# Patient Record
Sex: Female | Born: 1960 | Hispanic: Yes | Marital: Married | Smoking: Former smoker
Health system: Southern US, Community
[De-identification: ages and names within clinical notes are randomized; demographics above are authoritative.]

## PROBLEM LIST (undated history)

## (undated) DIAGNOSIS — O1490 Unspecified pre-eclampsia, unspecified trimester: Secondary | ICD-10-CM

## (undated) DIAGNOSIS — I10 Essential (primary) hypertension: Secondary | ICD-10-CM

## (undated) DIAGNOSIS — T7840XA Allergy, unspecified, initial encounter: Secondary | ICD-10-CM

## (undated) DIAGNOSIS — F32A Depression, unspecified: Secondary | ICD-10-CM

## (undated) HISTORY — PX: TUBAL LIGATION: SHX77

## (undated) HISTORY — PX: JOINT REPLACEMENT: SHX530

## (undated) HISTORY — DX: Allergy, unspecified, initial encounter: T78.40XA

## (undated) HISTORY — PX: FOOT SURGERY: SHX648

---

## 2005-10-01 ENCOUNTER — Ambulatory Visit: Payer: Self-pay | Admitting: *Deleted

## 2012-12-06 ENCOUNTER — Emergency Department: Payer: Self-pay | Admitting: Emergency Medicine

## 2017-05-19 ENCOUNTER — Emergency Department: Payer: BLUE CROSS/BLUE SHIELD

## 2017-05-19 ENCOUNTER — Observation Stay
Admission: EM | Admit: 2017-05-19 | Discharge: 2017-05-20 | Disposition: A | Discharge status: Home / Self Care | Payer: BLUE CROSS/BLUE SHIELD | Attending: Internal Medicine | Admitting: Internal Medicine

## 2017-05-19 DIAGNOSIS — R52 Pain, unspecified: Secondary | ICD-10-CM | POA: Diagnosis not present

## 2017-05-19 DIAGNOSIS — Z8249 Family history of ischemic heart disease and other diseases of the circulatory system: Secondary | ICD-10-CM | POA: Diagnosis not present

## 2017-05-19 DIAGNOSIS — S76309A Unspecified injury of muscle, fascia and tendon of the posterior muscle group at thigh level, unspecified thigh, initial encounter: Secondary | ICD-10-CM | POA: Diagnosis present

## 2017-05-19 DIAGNOSIS — Z9889 Other specified postprocedural states: Secondary | ICD-10-CM | POA: Diagnosis not present

## 2017-05-19 DIAGNOSIS — Z87891 Personal history of nicotine dependence: Secondary | ICD-10-CM | POA: Diagnosis not present

## 2017-05-19 DIAGNOSIS — M7121 Synovial cyst of popliteal space [Baker], right knee: Secondary | ICD-10-CM | POA: Diagnosis not present

## 2017-05-19 DIAGNOSIS — W000XXA Fall on same level due to ice and snow, initial encounter: Secondary | ICD-10-CM | POA: Insufficient documentation

## 2017-05-19 DIAGNOSIS — S76312A Strain of muscle, fascia and tendon of the posterior muscle group at thigh level, left thigh, initial encounter: Secondary | ICD-10-CM | POA: Diagnosis not present

## 2017-05-19 DIAGNOSIS — M7122 Synovial cyst of popliteal space [Baker], left knee: Secondary | ICD-10-CM | POA: Insufficient documentation

## 2017-05-19 DIAGNOSIS — R2689 Other abnormalities of gait and mobility: Secondary | ICD-10-CM | POA: Insufficient documentation

## 2017-05-19 DIAGNOSIS — W19XXXA Unspecified fall, initial encounter: Secondary | ICD-10-CM

## 2017-05-19 DIAGNOSIS — Z833 Family history of diabetes mellitus: Secondary | ICD-10-CM | POA: Diagnosis not present

## 2017-05-19 DIAGNOSIS — R1011 Right upper quadrant pain: Secondary | ICD-10-CM

## 2017-05-19 LAB — COMPREHENSIVE METABOLIC PANEL
ALBUMIN: 3.8 g/dL (ref 3.5–5.0)
ALT: 18 U/L (ref 14–54)
ANION GAP: 8 (ref 5–15)
AST: 30 U/L (ref 15–41)
Alkaline Phosphatase: 68 U/L (ref 38–126)
BILIRUBIN TOTAL: 0.6 mg/dL (ref 0.3–1.2)
BUN: 13 mg/dL (ref 6–20)
CALCIUM: 9.1 mg/dL (ref 8.9–10.3)
CO2: 22 mmol/L (ref 22–32)
Chloride: 108 mmol/L (ref 101–111)
Creatinine, Ser: 0.69 mg/dL (ref 0.44–1.00)
GFR calc Af Amer: >60 mL/min (ref >60)
GLUCOSE: 146 mg/dL — AB (ref 65–99)
POTASSIUM: 4.7 mmol/L (ref 3.5–5.1)
Sodium: 138 mmol/L (ref 135–145)
TOTAL PROTEIN: 6.9 g/dL (ref 6.5–8.1)

## 2017-05-19 LAB — CBC WITH DIFFERENTIAL/PLATELET
BASOS ABS: 0 10*3/uL (ref 0–0.1)
BASOS PCT: 0 %
EOS PCT: 5 %
Eosinophils Absolute: 0.6 10*3/uL (ref 0–0.7)
HCT: 38.9 % (ref 35.0–47.0)
Hemoglobin: 13.3 g/dL (ref 12.0–16.0)
LYMPHS PCT: 21 %
Lymphs Abs: 2.2 10*3/uL (ref 1.0–3.6)
MCH: 30.3 pg (ref 26.0–34.0)
MCHC: 34.1 g/dL (ref 32.0–36.0)
MCV: 88.7 fL (ref 80.0–100.0)
MONO ABS: 0.5 10*3/uL (ref 0.2–0.9)
Monocytes Relative: 5 %
NEUTROS ABS: 7.3 10*3/uL — AB (ref 1.4–6.5)
Neutrophils Relative %: 69 %
PLATELETS: 236 10*3/uL (ref 150–440)
RBC: 4.39 MIL/uL (ref 3.80–5.20)
RDW: 12.8 % (ref 11.5–14.5)
WBC: 10.5 10*3/uL (ref 3.6–11.0)

## 2017-05-19 MED ORDER — SODIUM CHLORIDE 0.9 % IV BOLUS (SEPSIS)
1000.0000 mL | Freq: Once | INTRAVENOUS | Status: AC
  Administered 2017-05-19: 1000 mL via INTRAVENOUS

## 2017-05-19 MED ORDER — FENTANYL CITRATE (PF) 100 MCG/2ML IJ SOLN
50.0000 ug | Freq: Once | INTRAMUSCULAR | Status: AC
Start: 2017-05-19 — End: 2017-05-19
  Administered 2017-05-19: 50 ug via INTRAVENOUS
  Filled 2017-05-19: qty 2

## 2017-05-19 MED ORDER — ONDANSETRON 4 MG PO TBDP
4.0000 mg | ORAL_TABLET | Freq: Once | ORAL | Status: AC
  Administered 2017-05-19: 4 mg via ORAL
  Filled 2017-05-19: qty 1

## 2017-05-19 MED ORDER — FENTANYL CITRATE (PF) 100 MCG/2ML IJ SOLN
50.0000 ug | Freq: Once | INTRAMUSCULAR | Status: AC
  Administered 2017-05-19: 50 ug via INTRAVENOUS
  Filled 2017-05-19: qty 2

## 2017-05-19 NOTE — ED Triage Notes (Addendum)
Pedal pulse not felt in right foot manually. Capillary refil < 3 seconds bilaterally. Pedal pulse detected with doppler.

## 2017-05-19 NOTE — ED Provider Notes (Signed)
[facility] Emergency Department Provider Note  ____________________________________________  Time seen: Approximately 10:59 PM  I have reviewed the triage vital signs and the nursing notes.   HISTORY  Chief Complaint Fall and Leg Pain    HPI Angel Douglas is a 57 y.o. female who presents the emergency department complaining of sharp right mid femur pain.  Patient reports that she was in her backyard, slipped on some mild, did a split.  Patient reports that her right leg directly out front of her.  She is having pain to the posterior mid femur.  Patient reports that the pain was so severe, she became lightheaded, dizzy, nauseated.  Patient has had emesis from the nausea.  She did not hit her head or lose consciousness.  She does not take any medications prior to arrival.  No history of previous hip or finger fractures.  Patient denies any back pain, numbness or tingling in the lower extremity.  No other complaints at this time.  History reviewed. No pertinent past medical history.  There are no active problems to display for this patient.   Past Surgical History:  Procedure Laterality Date  . CESAREAN SECTION    . FOOT SURGERY    . JOINT REPLACEMENT    . TUBAL LIGATION      Prior to Admission medications   Not on File    Allergies Patient has no known allergies.  No family history on file.  Social History Social History   Tobacco Use  . Smoking status: Former Games developer  . Smokeless tobacco: Never Used  Substance Use Topics  . Alcohol use: Yes    Frequency: Never  . Drug use: Not on file     Review of Systems  Constitutional: No fever/chills Eyes: No visual changes. No discharge ENT: No upper respiratory complaints. Cardiovascular: no chest pain. Respiratory: no cough. No SOB. Gastrointestinal: No abdominal pain.  Positive for nausea and emesis.   Musculoskeletal: Positive for right posterior mid femur pain Skin: Negative for rash,  abrasions, lacerations, ecchymosis. Neurological: Negative for headaches, focal weakness or numbness. 10-point ROS otherwise negative.  ____________________________________________   PHYSICAL EXAM:  VITAL SIGNS: ED Triage Vitals  Enc Vitals Group     BP 05/19/17 2134 93/72     Pulse Rate 05/19/17 2134 (!) 131     Resp 05/19/17 2134 (!) 21     Temp 05/19/17 2134 97.6 F (36.4 C)     Temp Source 05/19/17 2134 Oral     SpO2 05/19/17 2134 95 %     Weight 05/19/17 2138 193 lb (87.5 kg)     Height --      Head Circumference --      Peak Flow --      Pain Score 05/19/17 2138 10     Pain Loc --      Pain Edu? --      Excl. in GC? --      Constitutional: Alert and oriented. Well appearing and in no acute distress. Eyes: Conjunctivae are normal. PERRL. EOMI. Head: Atraumatic. Neck: No stridor.    Cardiovascular: Normal rate, regular rhythm. Normal S1 and S2.  Good peripheral circulation. Respiratory: Normal respiratory effort without tachypnea or retractions. Lungs CTAB. Good air entry to the bases with no decreased or absent breath sounds. Musculoskeletal: Good range of motion to the right lower extremity due to pain.  Patient is very tender to palpation over the posterior midshaft femur.  Patient does have appreciable palpable deformity.  Palpation over the distal hamstring region reveals palpable deficit with compared with other extremity..  On palpation, distal hamstring tendons are not appreciated.  Examination of the hip and knee is feeling no tenderness to palpation.  Full range of motion to the hip and knee..  Dorsalis pedis pulse was initially difficult to obtain, obtained using Doppler ultrasound.  At this time, patient does have an appreciable dorsalis pedis pulse with direct palpation.  Sensation intact all 5 digits. Neurologic:  Normal speech and language. No gross focal neurologic deficits are appreciated.  Skin:  Skin is warm, dry and intact. No rash noted. Psychiatric:  Mood and affect are normal. Speech and behavior are normal. Patient exhibits appropriate insight and judgement.   ____________________________________________   LABS (all labs ordered are listed, but only abnormal results are displayed)  Labs Reviewed  COMPREHENSIVE METABOLIC PANEL - Abnormal; Notable for the following components:      Result Value   Glucose, Bld 146 (*)    All other components within normal limits  CBC WITH DIFFERENTIAL/PLATELET - Abnormal; Notable for the following components:   Neutro Abs 7.3 (*)    All other components within normal limits   ____________________________________________  EKG   ____________________________________________  RADIOLOGY Festus BarrenI, Jonathan D Cuthriell, personally viewed and evaluated these images (plain radiographs) as part of my medical decision making, as well as reviewing the written report by the radiologist.  Dg Pelvis 1-2 Views  Result Date: 05/19/2017 CLINICAL DATA:  Pain after slipped in mud today. EXAM: PELVIS - 1-2 VIEW COMPARISON:  None. FINDINGS: There is no evidence of pelvic fracture or diastasis. No pelvic bone lesions are seen. Lower lumbar facet arthropathy seen at L4-5 and L5-S1. Intact sacroiliac joints and pubic symphysis. Intact hip joints. Small phlebolith in the right hemipelvis. IMPRESSION: No acute osseous abnormality. Electronically Signed   By: Tollie Ethavid  Kwon M.D.   On: 05/19/2017 22:14   [place] Rt Lower Extrem Ltd Soft Tissue Non Vascular  Result Date: 05/19/2017 CLINICAL DATA:  Fall with pain to the right upper leg EXAM: ULTRASOUND right LOWER EXTREMITY LIMITED TECHNIQUE: Ultrasound examination of the lower extremity soft tissues was performed in the area of clinical concern. COMPARISON:  None. FINDINGS: Targeted ultrasound of the posterior thigh performed in the region of pain. In this region is a large heterogeneous hyper and hypoechoic ill-defined mass measuring 10.1 x 4.3 x 7.3 cm. No appreciable internal flow on  color Doppler imaging. IMPRESSION: Large heterogeneous hyper and hypoechoic mass measuring at least 10.1 cm within the posterior thigh soft tissues in the region of pain. This appears to be within the deeper soft tissues, and may reflect an intramuscular hematoma Electronically Signed   By: Jasmine PangKim  Fujinaga M.D.   On: 05/19/2017 23:51   Dg Femur Min 2 Views Right  Result Date: 05/19/2017 CLINICAL DATA:  Right upper thigh pain after slipping in much tonight. EXAM: RIGHT FEMUR 2 VIEWS COMPARISON:  None. FINDINGS: There is no evidence of fracture or other focal bone lesions. Vascular channel is noted within the posterior diaphysis of the femur on the frog-leg lateral view. Soft tissues are unremarkable. IMPRESSION: No acute fracture nor joint dislocations involving the right femur. Electronically Signed   By: Tollie Ethavid  Kwon M.D.   On: 05/19/2017 22:13    ____________________________________________    PROCEDURES  Procedure(s) performed:    Procedures    Medications  ondansetron (ZOFRAN-ODT) disintegrating tablet 4 mg (4 mg Oral Given 05/19/17 2226)  sodium chloride 0.9 % bolus 1,000 mL (  1,000 mLs Intravenous New Bag/Given 05/19/17 2226)  fentaNYL (SUBLIMAZE) injection 50 mcg (50 mcg Intravenous Given 05/19/17 2227)  fentaNYL (SUBLIMAZE) injection 50 mcg (50 mcg Intravenous Given 05/19/17 2337)     ____________________________________________   INITIAL IMPRESSION / ASSESSMENT AND PLAN / ED COURSE  Pertinent labs & imaging results that were available during my care of the patient were reviewed by me and considered in my medical decision making (see chart for details).  Review of the [place] CSRS was performed in accordance of the [facility] prior to dispensing any controlled drugs.  Clinical Course as of May 20 17  Wed May 19, 2017  2305 Patient presented to the emergency department complaining of severe right posterior mid femur pain.  Initial x-rays were unremarkable for fracture.  On exam,  patient's symptoms, physical exam findings are consistent with rupture of the hamstring.  Patient will be evaluated with soft tissue ultrasound  [JC]  Thu May 20, 2017  0004 She is ultrasound returned with a heterogenous hyper and hypoechoic mass of at least 10.1 cm in the posterior soft tissues of the thigh.  At this time, MRI will be ordered to further evaluate this area.  [JC]    Clinical Course User Index [JC] Cuthriell, Delorise Royals, PA-C    Patient presented to the emergency department complaining of severe posterior right mid femur pain.  Initial x-rays reveal no acute fracture.  Likely hypertensive, with difficulty appreciating the dorsalis pedis pulse.  Exam was concerning for hamstring rupture versus arterial injury.  Ultrasound reveals nonspecific mass in the posterior musculature.  Since pressure has improved to 114/54 at this time.  Patient has received 1 L of fluids, IV pain medication.  She is appreciably more stable.  At this time, MRI is ordered to further evaluate.  I discussed the patient with Dr. Ernest Pine the on-call orthopedic surgeon.  He advises that if this does return with a hamstring rupture, he will follow-up with patient as an outpatient and discuss options with her at that time.  He states that this time it would not be an urgent surgical issue.  At this time, section of the emergency department is closing and patient care is turned over to attending provider, Dr. Manson Passey for further management.      This chart was dictated using voice recognition software/Dragon. Despite best efforts to proofread, errors can occur which can change the meaning. Any change was purely unintentional.    Racheal Patches, PA-C 05/20/17 0019    Darci Current, MD 05/20/17 [phone]

## 2017-05-19 NOTE — ED Notes (Signed)
Patient transported to Ultrasound 

## 2017-05-19 NOTE — ED Notes (Signed)
Patient states she walking to chicken coop in the yard and slipped on mud wearing flip flops. Now c/o right thigh pain. Patient continues to have palpable pulses bilaterally. No obvious deformity present.

## 2017-05-19 NOTE — ED Triage Notes (Signed)
Patient slipped in mud. Patient c/o right upper thigh pain. Patient c/o nausea and 10 out of 10 pain.

## 2017-05-20 ENCOUNTER — Emergency Department: Payer: BLUE CROSS/BLUE SHIELD

## 2017-05-20 ENCOUNTER — Encounter: Payer: Self-pay | Admitting: Internal Medicine

## 2017-05-20 ENCOUNTER — Other Ambulatory Visit: Payer: Self-pay

## 2017-05-20 DIAGNOSIS — R52 Pain, unspecified: Secondary | ICD-10-CM | POA: Diagnosis present

## 2017-05-20 LAB — HEMOGLOBIN AND HEMATOCRIT, BLOOD
HEMATOCRIT: 31.8 % — AB (ref 35.0–47.0)
HEMOGLOBIN: 11.1 g/dL — AB (ref 12.0–16.0)

## 2017-05-20 MED ORDER — OXYCODONE-ACETAMINOPHEN 7.5-325 MG PO TABS: 1.0000 | ORAL_TABLET | ORAL | 0 refills | Status: AC | PRN

## 2017-05-20 MED ORDER — DOCUSATE SODIUM 100 MG PO CAPS: 100.0000 mg | ORAL_CAPSULE | Freq: Two times a day (BID) | ORAL | 0 refills | Status: DC

## 2017-05-20 MED ORDER — ENOXAPARIN SODIUM 40 MG/0.4ML ~~LOC~~ SOLN: 40.0000 mg | SUBCUTANEOUS | Status: DC

## 2017-05-20 MED ORDER — MORPHINE SULFATE (PF) 2 MG/ML IV SOLN: 2.0000 mg | INTRAVENOUS | Status: DC | PRN

## 2017-05-20 MED ORDER — ACETAMINOPHEN 325 MG PO TABS: 650.0000 mg | ORAL_TABLET | Freq: Four times a day (QID) | ORAL | 0 refills | Status: AC | PRN

## 2017-05-20 MED ORDER — ACETAMINOPHEN 650 MG RE SUPP: 650.0000 mg | Freq: Four times a day (QID) | RECTAL | Status: DC | PRN

## 2017-05-20 MED ORDER — ONDANSETRON HCL 4 MG/2ML IJ SOLN: 4.0000 mg | Freq: Four times a day (QID) | INTRAMUSCULAR | Status: DC | PRN

## 2017-05-20 MED ORDER — OXYCODONE HCL 5 MG PO TABS
5.0000 mg | ORAL_TABLET | ORAL | Status: DC | PRN
  Administered 2017-05-20: 5 mg via ORAL
  Filled 2017-05-20: qty 1

## 2017-05-20 MED ORDER — ONDANSETRON HCL 4 MG PO TABS: 4.0000 mg | ORAL_TABLET | Freq: Four times a day (QID) | ORAL | Status: DC | PRN

## 2017-05-20 MED ORDER — OXYCODONE HCL 5 MG PO TABS: 10.0000 mg | ORAL_TABLET | ORAL | Status: DC | PRN

## 2017-05-20 MED ORDER — MORPHINE SULFATE (PF) 4 MG/ML IV SOLN
4.0000 mg | Freq: Once | INTRAVENOUS | Status: AC
  Administered 2017-05-20: 4 mg via INTRAVENOUS
  Filled 2017-05-20: qty 1

## 2017-05-20 MED ORDER — DOCUSATE SODIUM 100 MG PO CAPS
100.0000 mg | ORAL_CAPSULE | Freq: Two times a day (BID) | ORAL | Status: DC
  Filled 2017-05-20: qty 1

## 2017-05-20 MED ORDER — MORPHINE SULFATE (PF) 4 MG/ML IV SOLN
INTRAVENOUS | Status: AC
  Administered 2017-05-20: 05:00:00
  Filled 2017-05-20: qty 1

## 2017-05-20 MED ORDER — OXYCODONE HCL 5 MG PO TABS: 5.0000 mg | ORAL_TABLET | ORAL | 0 refills | Status: DC | PRN

## 2017-05-20 MED ORDER — ACETAMINOPHEN 325 MG PO TABS
650.0000 mg | ORAL_TABLET | Freq: Four times a day (QID) | ORAL | Status: DC | PRN
  Administered 2017-05-20: 650 mg via ORAL
  Filled 2017-05-20: qty 2

## 2017-05-20 NOTE — ED Provider Notes (Signed)
I assumed care of the patient from Cuthriell PA at 11:00 PM.  MRI was performed secondary suspicion for hamstring rupture.  Spoke with the radiologist who confirmed the presence of a hamstring tear with a hamstring tendon avulsion distally.  Spoke with Dr. Ernest PineHooten regarding all clinical findings with recommendation to discharge patient home with outpatient follow-up today however unable to control the patient's pain with IV narcotics and as such patient admitted to Dr. Sheryle Hailiamond secondary to intractable pain.   Darci CurrentBrown, [place] N, MD 05/20/17 [phone]

## 2017-05-20 NOTE — Progress Notes (Signed)
Patient requesting to see Doctor Hooten. Patient made a call to after hours office and they told the pt that MD  is not coming to see pt. Patient requesting to be discharged. MD Hooten paged to confirm. MD Hooten now on the floor.

## 2017-05-20 NOTE — Progress Notes (Signed)
Physical Therapy Evaluation Patient Details Name: Angel Douglas MRN: [id number] DOB: 1961-04-06 Today's Date: 05/20/2017   History of Present Illness  The patient with no chronic medical problems presents to the emergency department after suffering a fall after slipping on water. The patient states that she landed in a split with her RLE and immediately felt pain in her right leg.  Upon examination in the emergency department is obvious the patient had torn her hamstring and was unable to walk without assistance.  Orthopedic surgery was consulted who agreed to see the patient as on an outpatient basis but the patient's pain was too great to bear and she is now admitted under observation status. MRI of R thigh showed right hamstring avulsion with the semitendinosus tendon retracted about 5.5 cm and the semimembranosus retracted up to 14 cm distally as well as muscle strain/tear of the right adductor magnus muscle.   Clinical Impression  Pt admitted with above diagnosis. Pt currently with functional limitations due to the deficits listed below (see PT Problem List). She is independent/modified independent for bed mobility and transfers. With transfers pt demonstrates safe technique and fair weight acceptance to RLE. Good stability when upright in standing. Pt with KI donned during all bed mobility, transfers, and ambulation. Pt ambulates to rehab gym and back to her room with rolling walker. Decreased weight shifting to RLE but considering extent of her injury weight acceptance is considerable. Pt is in fact able to accept full weight on RLE as she is able to take a few short steps without UE support. Reports increase in pain to 5/10 with ambulation. Pt provided education about proper sequencing with stairs. She is able to ascend/descend 4 steps with single hand held assist only. Good safety and stability demonstrated with ambulation. She will be safe to return home with intermittent assist from her  daughter and husband who is out of town most of the time. Pt will benefit from OP PT per ortho recommendations depending on plan of care established. Pt will benefit from PT services to address deficits in strength, balance, and mobility in order to return to full function at home.       Follow Up Recommendations Outpatient PT;Other (comment)(Pending plan of care by orthopedist)    Equipment Recommendations  None recommended by PT;Other (comment)(Pt prefers to use her axillary crutches at home)    Recommendations for Other Services       Precautions / Restrictions Precautions Precautions: None Required Braces or Orthoses: Knee Immobilizer - Right Knee Immobilizer - Right: On when out of bed or walking Restrictions Weight Bearing Restrictions: Yes RLE Weight Bearing: Weight bearing as tolerated Other Position/Activity Restrictions: WBAT per Dr Ernest Pine over the phone. Maintain Knee immobilizer during ambulation for stability and comfort      Mobility  Bed Mobility Overal bed mobility: Independent             General bed mobility comments: Good speed and sequencing with bed mobility.  Transfers Overall transfer level: Modified independent Equipment used: None Transfers: Sit to/from Stand Sit to Stand: Modified independent (Device/Increase time)         General transfer comment: Pt demonstrates safe transfers and fair weight acceptance to RLE. Good stability when upright in standing. Pt with KI donned during all bed mobility, transfers, and ambulation  Ambulation/Gait Ambulation/Gait assistance: Min guard Ambulation Distance (Feet): 200 Feet Assistive device: Rolling walker (2 wheeled) Gait Pattern/deviations: Decreased step length - left;Decreased weight shift to right Gait velocity: Decreased Gait  velocity interpretation: <1.8 ft/sec, indicative of risk for recurrent falls General Gait Details: Pt ambulates to rehab gym and back to her room with rolling walker.  Decreased weight shifting to RLE but considering extent of her injury weight acceptance is considerable. Pt is in fact able to accept full weight on RLE as she is able to take a few short steps without UE support. Reports increase in pain to 5/10 with ambulation  Stairs Stairs: Yes Stairs assistance: Min guard Stair Management: (Hand-held assistance) Number of Stairs: 4 General stair comments: Pt provided education about proper sequencing with stairs. She is able to ascend/descend 4 steps with single hand held assist only. Good safety and stability demonstrated  Wheelchair Mobility    Modified Rankin (Stroke Patients Only)       Balance Overall balance assessment: Needs assistance Sitting-balance support: No upper extremity supported Sitting balance-Leahy Scale: Good     Standing balance support: No upper extremity supported Standing balance-Leahy Scale: Fair Standing balance comment: Able to balance in wide stance without UE support                             Pertinent Vitals/Pain Pain Assessment: 0-10 Pain Score: 5  Pain Location: RLE. Denies pain at rest but reports increase in pain with ambulation Pain Intervention(s): Monitored during session;Premedicated before session    Home Living Family/patient expects to be discharged to:: Private residence Living Arrangements: Spouse/significant other Available Help at Discharge: Family Type of Home: House Home Access: Stairs to enter Entrance Stairs-Rails: None Entrance Stairs-Number of Steps: 3 Home Layout: One level Home Equipment: Cane - single point;Crutches(no shower seat, no walker)      Prior Function Level of Independence: Independent         Comments: Works at the [employer]. Drives and is a full community ambulator without assistive device. No other falls in the last 12 months     Hand Dominance   Dominant Hand: Right    Extremity/Trunk Assessment   Upper  Extremity Assessment Upper Extremity Assessment: Overall WFL for tasks assessed    Lower Extremity Assessment Lower Extremity Assessment: RLE deficits/detail RLE Deficits / Details: Pt reports fully intact sensation to light touch. Full DF/PF. Limited strength testing of RLE due to pain       Communication   Communication: No difficulties  Cognition Arousal/Alertness: Awake/alert Behavior During Therapy: Agitated Overall Cognitive Status: Within Functional Limits for tasks assessed                                 General Comments: Pt is very defensive and expresses frustration with having not seen an orthopedist since admission to the ER      General Comments      Exercises     Assessment/Plan    PT Assessment Patient needs continued PT services  PT Problem List Decreased strength;Decreased mobility;Pain       PT Treatment Interventions DME instruction;Gait training;Stair training;Therapeutic activities;Therapeutic exercise;Balance training;Patient/family education;Manual techniques    PT Goals (Current goals can be found in the Care Plan section)  Acute Rehab PT Goals Patient Stated Goal: Return to prior function at home.  PT Goal Formulation: With patient Time For Goal Achievement: 06/03/17 Potential to Achieve Goals: Good    Frequency 7X/week   Barriers to discharge Decreased caregiver support Lives alone. Husband is out of town for multiple  weeks at a time    Co-evaluation               AM-PAC PT "6 Clicks" Daily Activity  Outcome Measure Difficulty turning over in bed (including adjusting bedclothes, sheets and blankets)?: None Difficulty moving from lying on back to sitting on the side of the bed? : None Difficulty sitting down on and standing up from a chair with arms (e.g., wheelchair, bedside commode, etc,.)?: A Little Help needed moving to and from a bed to chair (including a wheelchair)?: None Help needed walking in hospital  room?: A Little Help needed climbing 3-5 steps with a railing? : A Little 6 Click Score: 21    End of Session Equipment Utilized During Treatment: Gait belt Activity Tolerance: Patient tolerated treatment well Patient left: in bed;with call bell/phone within reach   PT Visit Diagnosis: Muscle weakness (generalized) (M62.81);Pain Pain - Right/Left: Right Pain - part of body: Leg    Time: 1424-1445 PT Time Calculation (min) (ACUTE ONLY): 21 min   Charges:   PT Evaluation $PT Eval Low Complexity: 1 Low     PT G Codes:        Abigael Mogle D Braxon Suder PT, DPT    Sima Lindenberger 05/20/2017, 3:21 PM

## 2017-05-20 NOTE — Evaluation (Signed)
Occupational Therapy Evaluation Patient Details Name: Angel Douglas MRN: [id number] DOB: 03-Jul-1960 Today's Date: 05/20/2017    History of Present Illness The patient with no chronic medical problems presents to the emergency department after suffering a fall after slipping on water. The patient states that she landed in a split with her RLE and immediately felt pain in her right leg.  Upon examination in the emergency department is obvious the patient had torn her hamstring and was unable to walk without assistance.  Orthopedic surgery was consulted who agreed to see the patient as on an outpatient basis but the patient's pain was too great to bear and she is now admitted under observation status. MRI of R thigh showed right hamstring avulsion with the semitendinosus tendon retracted about 5.5 cm and the semimembranosus retracted up to 14 cm distally as well as muscle strain/tear of the right adductor magnus muscle.    Clinical Impression   Pt seen for OT evaluation this date. Pt was independent at baseline and eager to return to PLOF. Pt presents with RLE pain with movement and requires PRN min assist for LB ADL tasks. Educated in AE for LB dressing to improve independence. Pt/dtr deny additional skilled OT needs at this time. Family able to assist as needed for ADL and mobility. Will sign off. Please re-consult if additional needs arise.     Follow Up Recommendations  No OT follow up    Equipment Recommendations  None recommended by OT    Recommendations for Other Services       Precautions / Restrictions Precautions Precautions: None Required Braces or Orthoses: Knee Immobilizer - Right Knee Immobilizer - Right: On when out of bed or walking Restrictions Weight Bearing Restrictions: Yes RLE Weight Bearing: Weight bearing as tolerated Other Position/Activity Restrictions: WBAT per Dr Ernest Pine over the phone. Maintain Knee immobilizer during ambulation for stability and comfort      Mobility Bed Mobility Overal bed mobility: Independent             General bed mobility comments: Good speed and sequencing with bed mobility.  Transfers Overall transfer level: Modified independent Equipment used: None Transfers: Sit to/from Stand Sit to Stand: Modified independent (Device/Increase time)         General transfer comment: Pt demonstrates safe transfers and fair weight acceptance to RLE. Good stability when upright in standing. Pt with KI donned during all bed mobility, transfers, and ambulation    Balance Overall balance assessment: Needs assistance Sitting-balance support: No upper extremity supported Sitting balance-Leahy Scale: Good     Standing balance support: No upper extremity supported Standing balance-Leahy Scale: Fair Standing balance comment: Able to balance in wide stance without UE support                           ADL either performed or assessed with clinical judgement   ADL Overall ADL's : Needs assistance/impaired             Lower Body Bathing: Minimal assistance;Sit to/from stand;With caregiver independent assisting       Lower Body Dressing: Minimal assistance;With caregiver independent assisting;Sit to/from stand Lower Body Dressing Details (indicate cue type and reason): educated in AE for LB dressing to improve independence and maximize safety Toilet Transfer: Modified Independent;RW           Functional mobility during ADLs: Min guard;Rolling walker       Vision Baseline Vision/History: No visual deficits Patient Visual Report:  No change from baseline Vision Assessment?: No apparent visual deficits     Perception     Praxis      Pertinent Vitals/Pain Pain Assessment: (denies pain at rest, increases with mobility) Pain Score: 5  Pain Location: RLE. Denies pain at rest but reports increase in pain with ambulation Pain Intervention(s): Limited activity within patient's tolerance;Monitored during  session     Hand Dominance Right   Extremity/Trunk Assessment Upper Extremity Assessment Upper Extremity Assessment: Overall WFL for tasks assessed   Lower Extremity Assessment Lower Extremity Assessment: Defer to PT evaluation;RLE deficits/detail RLE Deficits / Details: Pt reports fully intact sensation to light touch. Full DF/PF. Limited strength testing of RLE due to pain       Communication Communication Communication: No difficulties   Cognition Arousal/Alertness: Awake/alert Behavior During Therapy: Agitated Overall Cognitive Status: Within Functional Limits for tasks assessed                                 General Comments: Pt is very defensive and expresses frustration with having not seen an orthopedist since admission to the ER   General Comments       Exercises     Shoulder Instructions      Home Living Family/patient expects to be discharged to:: Private residence Living Arrangements: Spouse/significant other Available Help at Discharge: Family Type of Home: House Home Access: Stairs to enter Secretary/administratorntrance Stairs-Number of Steps: 3 Entrance Stairs-Rails: None Home Layout: One level     Bathroom Shower/Tub: Chief Strategy OfficerTub/shower unit   Bathroom Toilet: Standard     Home Equipment: Gilmer Morane - single point;Crutches          Prior Functioning/Environment Level of Independence: Independent        Comments: Works at the [employer]. Drives and is a full community ambulator without assistive device. No other falls in the last 12 months        OT Problem List:        OT Treatment/Interventions:      OT Goals(Current goals can be found in the care plan section) Acute Rehab OT Goals Patient Stated Goal: Return to prior function at home.  OT Goal Formulation: All assessment and education complete, DC therapy  OT Frequency:     Barriers to D/C:            Co-evaluation              AM-PAC PT "6 Clicks" Daily  Activity     Outcome Measure Help from another person eating meals?: None Help from another person taking care of personal grooming?: None Help from another person toileting, which includes using toliet, bedpan, or urinal?: A Little Help from another person bathing (including washing, rinsing, drying)?: A Little Help from another person to put on and taking off regular upper body clothing?: None Help from another person to put on and taking off regular lower body clothing?: A Little 6 Click Score: 21   End of Session    Activity Tolerance: Patient tolerated treatment well Patient left: in bed;with call bell/phone within reach;with bed alarm set;with family/visitor present  OT Visit Diagnosis: Other abnormalities of gait and mobility (R26.89)                Time: 1355-1420 OT Time Calculation (min): 25 min Charges:  OT General Charges $OT Visit: 1 Visit OT Evaluation $OT Eval Low Complexity: 1 Low OT Treatments $  Self Care/Home Management : 8-22 mins  Richrd Prime, MPH, MS, OTR/L ascom [phone] 05/20/17, 3:53 PM

## 2017-05-20 NOTE — H&P (Signed)
Angel Douglas is an 57 y.o. female.   Chief Complaint: Fall HPI: The patient with no chronic medical problems presents to the emergency department after suffering a fall on ice.  The patient states that she landed in a split and immediately felt pain in her right leg.  Upon examination in the emergency department is obvious the patient had torn her hamstring and was unable to walk without assistance.  Orthopedic surgery was consulted who agreed to see the patient as on an outpatient basis but the patient's pain was too great to bear was found to the emergency department staff to call the hospitalist service for admission.  History reviewed. No pertinent past medical history. None  Past Surgical History:  Procedure Laterality Date  . CESAREAN SECTION    . FOOT SURGERY    . JOINT REPLACEMENT    . TUBAL LIGATION      Family History  Problem Relation Age of Onset  . Heart disease Mother   . Diabetes Mellitus II Father   . Diabetes Mellitus II Brother    Social History:  reports that she has quit smoking. she has never used smokeless tobacco. She reports that she drinks alcohol. Her drug history is not on file.  Allergies: No Known Allergies  No medications prior to admission.    Results for orders placed or performed during the hospital encounter of 05/19/17 (from the past 48 hour(s))  Comprehensive metabolic panel     Status: Abnormal   Collection Time: 05/19/17 10:12 PM  Result Value Ref Range   Sodium 138 135 - 145 mmol/L   Potassium 4.7 3.5 - 5.1 mmol/L    Comment: HEMOLYSIS AT THIS LEVEL MAY AFFECT RESULT   Chloride 108 101 - 111 mmol/L   CO2 22 22 - 32 mmol/L   Glucose, Bld 146 (H) 65 - 99 mg/dL   BUN 13 6 - 20 mg/dL   Creatinine, Ser 0.69 0.44 - 1.00 mg/dL   Calcium 9.1 8.9 - 10.3 mg/dL   Total Protein 6.9 6.5 - 8.1 g/dL   Albumin 3.8 3.5 - 5.0 g/dL   AST 30 15 - 41 U/L   ALT 18 14 - 54 U/L   Alkaline Phosphatase 68 38 - 126 U/L   Total Bilirubin 0.6 0.3 - 1.2  mg/dL   GFR calc non Af Amer >60 >60 mL/min   GFR calc Af Amer >60 >60 mL/min    Comment: (NOTE) The eGFR has been calculated using the CKD EPI equation. This calculation has not been validated in all clinical situations. eGFR's persistently <60 mL/min signify possible Chronic Kidney Disease.    Anion gap 8 5 - 15    Comment: Performed at [facility], [address] [place], [place] [address]  CBC with Differential     Status: Abnormal   Collection Time: 05/19/17 10:12 PM  Result Value Ref Range   WBC 10.5 3.6 - 11.0 K/uL   RBC 4.39 3.80 - 5.20 MIL/uL   Hemoglobin 13.3 12.0 - 16.0 g/dL   HCT 38.9 35.0 - 47.0 %   MCV 88.7 80.0 - 100.0 fL   MCH 30.3 26.0 - 34.0 pg   MCHC 34.1 32.0 - 36.0 g/dL   RDW 12.8 11.5 - 14.5 %   Platelets 236 150 - 440 K/uL   Neutrophils Relative % 69 %   Neutro Abs 7.3 (H) 1.4 - 6.5 K/uL   Lymphocytes Relative 21 %   Lymphs Abs 2.2 1.0 - 3.6 K/uL  Monocytes Relative 5 %   Monocytes Absolute 0.5 0.2 - 0.9 K/uL   Eosinophils Relative 5 %   Eosinophils Absolute 0.6 0 - 0.7 K/uL   Basophils Relative 0 %   Basophils Absolute 0.0 0 - 0.1 K/uL    Comment: Performed at [facility], [address] [place], [place] [address]   Dg Pelvis 1-2 Views  Result Date: 05/19/2017 CLINICAL DATA:  Pain after slipped in mud today. EXAM: PELVIS - 1-2 VIEW COMPARISON:  None. FINDINGS: There is no evidence of pelvic fracture or diastasis. No pelvic bone lesions are seen. Lower lumbar facet arthropathy seen at L4-5 and L5-S1. Intact sacroiliac joints and pubic symphysis. Intact hip joints. Small phlebolith in the right hemipelvis. IMPRESSION: No acute osseous abnormality. Electronically Signed   By: Ashley Royalty M.D.   On: 05/19/2017 22:14   [place] Rt Lower Extrem Ltd Soft Tissue Non Vascular  Result Date: 05/19/2017 CLINICAL DATA:  Fall with pain to the right upper leg EXAM: ULTRASOUND right LOWER EXTREMITY LIMITED TECHNIQUE: Ultrasound examination of the  lower extremity soft tissues was performed in the area of clinical concern. COMPARISON:  None. FINDINGS: Targeted ultrasound of the posterior thigh performed in the region of pain. In this region is a large heterogeneous hyper and hypoechoic ill-defined mass measuring 10.1 x 4.3 x 7.3 cm. No appreciable internal flow on color Doppler imaging. IMPRESSION: Large heterogeneous hyper and hypoechoic mass measuring at least 10.1 cm within the posterior thigh soft tissues in the region of pain. This appears to be within the deeper soft tissues, and may reflect an intramuscular hematoma Electronically Signed   By: Donavan Foil M.D.   On: 05/19/2017 23:51   Dg Femur Min 2 Views Right  Result Date: 05/19/2017 CLINICAL DATA:  Right upper thigh pain after slipping in much tonight. EXAM: RIGHT FEMUR 2 VIEWS COMPARISON:  None. FINDINGS: There is no evidence of fracture or other focal bone lesions. Vascular channel is noted within the posterior diaphysis of the femur on the frog-leg lateral view. Soft tissues are unremarkable. IMPRESSION: No acute fracture nor joint dislocations involving the right femur. Electronically Signed   By: Ashley Royalty M.D.   On: 05/19/2017 22:13    Review of Systems  Constitutional: Negative for chills and fever.  HENT: Negative for sore throat and tinnitus.   Eyes: Negative for blurred vision and redness.  Respiratory: Negative for cough and shortness of breath.   Cardiovascular: Negative for chest pain, palpitations, orthopnea and PND.  Gastrointestinal: Negative for abdominal pain, diarrhea, nausea and vomiting.  Genitourinary: Negative for dysuria, frequency and urgency.  Musculoskeletal: Positive for myalgias. Negative for joint pain.  Skin: Negative for rash.       No lesions  Neurological: Negative for speech change, focal weakness and weakness.  Endo/Heme/Allergies: Does not bruise/bleed easily.       No temperature intolerance  Psychiatric/Behavioral: Negative for  depression and suicidal ideas.    Blood pressure 107/80, pulse 70, temperature 97.6 F (36.4 C), temperature source Oral, resp. rate 18, weight 87.5 kg (193 lb), SpO2 98 %. Physical Exam  Vitals reviewed. Constitutional: She is oriented to person, place, and time. She appears well-developed and well-nourished. No distress.  HENT:  Head: Normocephalic and atraumatic.  Mouth/Throat: Oropharynx is clear and moist.  Eyes: Conjunctivae and EOM are normal. Pupils are equal, round, and reactive to light. No scleral icterus.  Neck: Normal range of motion. Neck supple. No JVD present. No tracheal deviation present. No thyromegaly  present.  Cardiovascular: Normal rate, regular rhythm and normal heart sounds. Exam reveals no gallop and no friction rub.  No murmur heard. Respiratory: Effort normal and breath sounds normal.  GI: Soft. Bowel sounds are normal. She exhibits no distension. There is no tenderness.  Genitourinary:  Genitourinary Comments: Deferred  Musculoskeletal: She exhibits no edema.  Cannot extend right leg due to pain and hamstring  Lymphadenopathy:    She has no cervical adenopathy.  Neurological: She is alert and oriented to person, place, and time. No cranial nerve deficit. She exhibits normal muscle tone.  Skin: Skin is warm and dry. No rash noted. No erythema.  Psychiatric: She has a normal mood and affect. Judgment and thought content normal.     Assessment/Plan This is a 57 year old female admitted for intractable pain  1.  Intractable pain: Secondary to hamstring tear; IV narcotics as needed.  PT/OT eval prior to discharge. 2.  DVT prophylaxis: Lanoxin 3.  GI prophylaxis: None The patient is a full code.  Time spent on admission orders and patient care approximately 45 minutes  Harrie Foreman, MD 05/20/2017, 5:38 AM

## 2017-05-20 NOTE — Progress Notes (Signed)
I have seen the patient around noon time, her daughter was present in the room. Had explained to her about the requirement for orthopedic follow-up. She denies much pain now, has immobilizer on her knee in place. I encouraged her to try to get up and walk to the bathroom to assess her functional status abilities. As suggested earlier in the ER by orthopedic, there may not be requirement for any urgent surgeries, in that case we would be able to discharge her in the evening after orthopedic physician evaluate her in the room. She understand and agree with the plan.

## 2017-05-20 NOTE — Consult Note (Signed)
ORTHOPAEDIC CONSULTATION  PATIENT NAME: Angel Douglas DOB: [birth year]  MRN: [id number]  REQUESTING PHYSICIAN: Altamese DillingVachhani, Vaibhavkumar, *  Chief Complaint: Right thigh pain  HPI: Angel Douglas is a 57 y.o. female who complains of posterior right thigh pain.  The patient apparently slipped and fell, landing in a "split" position.  She had the immediate onset of pain to the upper posterior thigh and had significant difficulty with standing or bearing weight due to the pain.  She denied any other injuries.  MRI of the right femur demonstrated right hamstring avulsion with the super tendinosis tendon retracted approximately 5.5 cm and the semimembranosus retracted up to 14 cm distally.  She was admitted to the hospitalist service for pain control.  History reviewed. No pertinent past medical history. Past Surgical History:  Procedure Laterality Date  . CESAREAN SECTION    . FOOT SURGERY    . JOINT REPLACEMENT    . TUBAL LIGATION     Social History   Socioeconomic History  . Marital status: Single    Spouse name: None  . Number of children: None  . Years of education: None  . Highest education level: None  Social Needs  . Financial resource strain: None  . Food insecurity - worry: None  . Food insecurity - inability: None  . Transportation needs - medical: None  . Transportation needs - non-medical: None  Occupational History  . None  Tobacco Use  . Smoking status: Former Games developermoker  . Smokeless tobacco: Never Used  Substance and Sexual Activity  . Alcohol use: Yes    Frequency: Never  . Drug use: None  . Sexual activity: None  Other Topics Concern  . None  Social History Narrative  . None   Family History  Problem Relation Age of Onset  . Heart disease Mother   . Diabetes Mellitus II Father   . Diabetes Mellitus II Brother    No Known Allergies Prior to Admission medications   Medication Sig Start Date End Date Taking? Authorizing Provider  acetaminophen  (TYLENOL) 325 MG tablet Take 2 tablets (650 mg total) by mouth every 6 (six) hours as needed for mild pain (or Fever >/= 101). 05/20/17   Altamese DillingVachhani, Vaibhavkumar, MD  docusate sodium (COLACE) 100 MG capsule Take 1 capsule (100 mg total) by mouth 2 (two) times daily. 05/20/17   Altamese DillingVachhani, Vaibhavkumar, MD  oxyCODONE (OXY IR/ROXICODONE) 5 MG immediate release tablet Take 1 tablet (5 mg total) by mouth every 4 (four) hours as needed for moderate pain or severe pain. 05/20/17   Altamese DillingVachhani, Vaibhavkumar, MD  oxyCODONE-acetaminophen (PERCOCET) 7.5-325 MG tablet Take 1 tablet by mouth every 4 (four) hours as needed for severe pain. 05/20/17 05/20/18  Darci CurrentBrown, [place] N, MD   Dg Pelvis 1-2 Views  Result Date: 05/19/2017 CLINICAL DATA:  Pain after slipped in mud today. EXAM: PELVIS - 1-2 VIEW COMPARISON:  None. FINDINGS: There is no evidence of pelvic fracture or diastasis. No pelvic bone lesions are seen. Lower lumbar facet arthropathy seen at L4-5 and L5-S1. Intact sacroiliac joints and pubic symphysis. Intact hip joints. Small phlebolith in the right hemipelvis. IMPRESSION: No acute osseous abnormality. Electronically Signed   By: Tollie Ethavid  Kwon M.D.   On: 05/19/2017 22:14   Mr Frmur Right Wo Contrast  Result Date: 05/20/2017 CLINICAL DATA:  Fall with right leg pain. EXAM: MRI OF THE RIGHT FEMUR WITHOUT CONTRAST TECHNIQUE: Multiplanar, multisequence MR imaging of the right femur was performed. No intravenous contrast was administered. COMPARISON:  Radiographs from 05/19/2017 FINDINGS: Bones/Joint/Cartilage No significant marrow edema, questionable slight cortical avulsion along the right ischial avulsion site. Ligaments N/A Muscles and Tendons Right hamstring avulsion with the semitendinosus tendon retracted about 5.5 cm and the semimembranosus retracted up to 14 cm distally. Indistinct biceps femoris muscle. There is considerable hematoma along the avulsion gap and tracking around the proximal margins of the musculature  with considerable regional edema which extends to involve the lower portion of the adjacent hip adductor musculature and tissue planes around the affect muscles. The abnormal edema in the adductor magnus muscle on the right may indicate a muscle strain/tear. Soft tissues The regional edema and hematoma related to the avulsion of the right hamstrings surrounds a significant portion of the sciatic nerve in the right lower extremity. No impingement at the sciatic notch. Note is made of small bilateral Baker's cysts. IMPRESSION: 1. Acute right hamstring avulsion with considerable distal retraction of the hamstring musculotendinous structures, intervening hematoma, and extensive edema. Some of this edema tracks around the sciatic nerve in the right thigh. 2. Muscle strain/tear of the right adductor magnus muscle. 3. Small bilateral Baker's cysts. Electronically Signed   By: Gaylyn Rong M.D.   On: 05/20/2017 08:09   [place] Rt Lower Extrem Ltd Soft Tissue Non Vascular  Result Date: 05/19/2017 CLINICAL DATA:  Fall with pain to the right upper leg EXAM: ULTRASOUND right LOWER EXTREMITY LIMITED TECHNIQUE: Ultrasound examination of the lower extremity soft tissues was performed in the area of clinical concern. COMPARISON:  None. FINDINGS: Targeted ultrasound of the posterior thigh performed in the region of pain. In this region is a large heterogeneous hyper and hypoechoic ill-defined mass measuring 10.1 x 4.3 x 7.3 cm. No appreciable internal flow on color Doppler imaging. IMPRESSION: Large heterogeneous hyper and hypoechoic mass measuring at least 10.1 cm within the posterior thigh soft tissues in the region of pain. This appears to be within the deeper soft tissues, and may reflect an intramuscular hematoma Electronically Signed   By: Jasmine Pang M.D.   On: 05/19/2017 23:51   Dg Femur Min 2 Views Right  Result Date: 05/19/2017 CLINICAL DATA:  Right upper thigh pain after slipping in much tonight. EXAM: RIGHT  FEMUR 2 VIEWS COMPARISON:  None. FINDINGS: There is no evidence of fracture or other focal bone lesions. Vascular channel is noted within the posterior diaphysis of the femur on the frog-leg lateral view. Soft tissues are unremarkable. IMPRESSION: No acute fracture nor joint dislocations involving the right femur. Electronically Signed   By: Tollie Eth M.D.   On: 05/19/2017 22:13    Positive ROS: All other systems have been reviewed and were otherwise negative with the exception of those mentioned in the HPI and as above.  Physical Exam: General: Well developed, well nourished female seen in no acute distress. Skin: No lesions in the area of chief complaint Neurologic: Awake, alert, and oriented. Sensory function is grossly intact. Motor strength is felt to be 5 over 5 with the exception of the right lower extremity due to the injury. No clonus or tremor. Good motor coordination. Lymphatic: No axillary or cervical lymphadenopathy  MUSCULOSKELETAL: Examination of the right lower extremity demonstrates significant tenderness to palpation on the ischium and proximal aspect of the posterior thigh.  No appreciable ecchymosis although moderate swelling is present.  Assessment: Right hamstring avulsion  Plan: Notes from physical therapy were reviewed and the patient was deemed to be safe with ambulation. The findings were discussed in detail with the  patient and her daughter.  I have contacted Dr. Armen Pickup at [facility] to arrange for consultation and possible surgical intervention. Continue with cold therapy to minimize swelling. I will make arrangements for a disc of the MRI to be available for her appointment with Dr. Armen Pickup.  Lelar Farewell P. Angie Fava M.D.

## 2017-05-20 NOTE — Progress Notes (Signed)
Spoke with Dr. Ernest PineHooten about the orthopedic consult. Order received to discontinue the patient's lovenxo. Order received for Oxycodone 5 mg every 4 hrs prn for moderate pain. Oxycodone 10 mg every 4 hrs prn for severe pain. Can place polar care to thigh

## 2017-05-25 NOTE — Discharge Summary (Signed)
[facility] - [place] at [facility]   PATIENT NAME: Angel Douglas    MR#:  [id number]  DATE OF BIRTH:  04-Sep-1960  DATE OF ADMISSION:  05/19/2017 ADMITTING PHYSICIAN: Arnaldo NatalMichael S Diamond, MD  DATE OF DISCHARGE: 05/20/2017  8:02 PM  PRIMARY CARE PHYSICIAN: Emogene MorganAycock, Ngwe A, MD    ADMISSION DIAGNOSIS:  Hamstring injury [S76.309A] RUQ pain [R10.11] Fall [W19.XXXA] Intractable pain [R52] Tear of left hamstring, initial encounter [S76.312A]  DISCHARGE DIAGNOSIS:  Active Problems:   Intractable pain   SECONDARY DIAGNOSIS:  History reviewed. No pertinent past medical history.  HOSPITAL COURSE:   Pt has hamstring tear- pain control achieved. Seen by orthopedics and pt choose to go to other sport physicians for further care. Pt is stable to be discharged.  DISCHARGE CONDITIONS:   Stable.  CONSULTS OBTAINED:  Treatment Team:  Lyndle HerrlichBowers, James R, MD Donato HeinzHooten, James P, MD  DRUG ALLERGIES:  No Known Allergies  DISCHARGE MEDICATIONS:   Allergies as of 05/20/2017   No Known Allergies     Medication List    TAKE these medications   acetaminophen 325 MG tablet Commonly known as:  TYLENOL Take 2 tablets (650 mg total) by mouth every 6 (six) hours as needed for mild pain (or Fever >/= 101).   docusate sodium 100 MG capsule Commonly known as:  COLACE Take 1 capsule (100 mg total) by mouth 2 (two) times daily.   oxyCODONE 5 MG immediate release tablet Commonly known as:  Oxy IR/ROXICODONE Take 1 tablet (5 mg total) by mouth every 4 (four) hours as needed for moderate pain or severe pain.   oxyCODONE-acetaminophen 7.5-325 MG tablet Commonly known as:  PERCOCET Take 1 tablet by mouth every 4 (four) hours as needed for severe pain.        DISCHARGE INSTRUCTIONS:    Follow with Ortho in clinic in 1 week.  If you experience worsening of your admission symptoms, develop shortness of breath, life threatening emergency, suicidal or homicidal  thoughts you must seek medical attention immediately by calling 911 or calling your MD immediately  if symptoms less severe.  You Must read complete instructions/literature along with all the possible adverse reactions/side effects for all the Medicines you take and that have been prescribed to you. Take any new Medicines after you have completely understood and accept all the possible adverse reactions/side effects.   Please note  You were cared for by a hospitalist during your hospital stay. If you have any questions about your discharge medications or the care you received while you were in the hospital after you are discharged, you can call the unit and asked to speak with the hospitalist on call if the hospitalist that took care of you is not available. Once you are discharged, your primary care physician will handle any further medical issues. Please note that NO REFILLS for any discharge medications will be authorized once you are discharged, as it is imperative that you return to your primary care physician (or establish a relationship with a primary care physician if you do not have one) for your aftercare needs so that they can reassess your need for medications and monitor your lab values.    Today   CHIEF COMPLAINT:   Chief Complaint  Patient presents with  . Fall  . Leg Pain    HISTORY OF PRESENT ILLNESS:  Angel Douglas  is a [age] y.o. female with no chronic medical problems presents to the emergency department after suffering  a fall on ice.  The patient states that she landed in a split and immediately felt pain in her right leg.  Upon examination in the emergency department is obvious the patient had torn her hamstring and was unable to walk without assistance.  Orthopedic surgery was consulted who agreed to see the patient as on an outpatient basis but the patient's pain was too great to bear was found to the emergency department staff to call the hospitalist service for  admission.   VITAL SIGNS:  Blood pressure 107/61, pulse 66, temperature 98.8 F (37.1 C), temperature source Oral, resp. rate 18, weight 87.5 kg (193 lb), SpO2 98 %.  I/O:  No intake or output data in the 24 hours ending 05/25/17 1108  PHYSICAL EXAMINATION:  Constitutional: She is oriented to person, place, and time. She appears well-developed and well-nourished. No distress.  HENT:  Head: Normocephalic and atraumatic.  Mouth/Throat: Oropharynx is clear and moist.  Eyes: Conjunctivae and EOM are normal. Pupils are equal, round, and reactive to light. No scleral icterus.  Neck: Normal range of motion. Neck supple. No JVD present. No tracheal deviation present. No thyromegaly present.  Cardiovascular: Normal rate, regular rhythm and normal heart sounds. Exam reveals no gallop and no friction rub.  No murmur heard. Respiratory: Effort normal and breath sounds normal.  GI: Soft. Bowel sounds are normal. She exhibits no distension. There is no tenderness.  Genitourinary:  Genitourinary Comments: Deferred  Musculoskeletal: She exhibits no edema.  Cannot extend right leg due to pain and hamstring  Lymphadenopathy:    She has no cervical adenopathy.  Neurological: She is alert and oriented to person, place, and time. No cranial nerve deficit. She exhibits normal muscle tone.  Skin: Skin is warm and dry. No rash noted. No erythema.  Psychiatric: She has a normal mood and affect. Judgment and thought content normal.     DATA REVIEW:   CBC Recent Labs  Lab 05/19/17 2212 05/20/17 0816  WBC 10.5  --   HGB 13.3 11.1*  HCT 38.9 31.8*  PLT 236  --     Chemistries  Recent Labs  Lab 05/19/17 2212  NA 138  K 4.7  CL 108  CO2 22  GLUCOSE 146*  BUN 13  CREATININE 0.69  CALCIUM 9.1  AST 30  ALT 18  ALKPHOS 68  BILITOT 0.6    Cardiac Enzymes No results for input(s): TROPONINI in the last 168 hours.  Microbiology Results  No results found for this or any previous  visit.  RADIOLOGY:  No results found.  EKG:  No orders found for this or any previous visit.    Management plans discussed with the patient, family and they are in agreement.  CODE STATUS:  Code Status History    Date Active Date Inactive Code Status Order ID Comments User Context   05/20/2017 08:00 05/20/2017 23:08 Full Code [id number]  Arnaldo Natal, MD Inpatient      TOTAL TIME TAKING CARE OF THIS PATIENT: 35 minutes.    Altamese Dilling M.D on 05/25/2017 at 11:08 AM  Between 7am to 6pm - Pager - [phone]  After 6pm go to [email or web address] - Social research officer, government  [facility]  Office  [phone]  CC: Primary care physician; Emogene Morgan, MD   Note: This dictation was prepared with Dragon dictation along with smaller phrase technology. Any transcriptional errors that result from this process are unintentional.

## 2017-09-10 ENCOUNTER — Encounter (INDEPENDENT_AMBULATORY_CARE_PROVIDER_SITE_OTHER): Payer: Self-pay | Admitting: Vascular Surgery

## 2017-09-21 ENCOUNTER — Ambulatory Visit (INDEPENDENT_AMBULATORY_CARE_PROVIDER_SITE_OTHER): Payer: BLUE CROSS/BLUE SHIELD | Admitting: Vascular Surgery

## 2017-09-21 ENCOUNTER — Encounter (INDEPENDENT_AMBULATORY_CARE_PROVIDER_SITE_OTHER): Payer: Self-pay | Admitting: Vascular Surgery

## 2017-09-21 VITALS — BP 134/80 | HR 61 | Resp 16 | Ht 61.0 in | Wt 192.2 lb

## 2017-09-21 DIAGNOSIS — I83813 Varicose veins of bilateral lower extremities with pain: Secondary | ICD-10-CM | POA: Diagnosis not present

## 2017-09-21 DIAGNOSIS — I739 Peripheral vascular disease, unspecified: Secondary | ICD-10-CM | POA: Diagnosis not present

## 2017-09-21 NOTE — Progress Notes (Signed)
Subjective:    Patient ID: Angel Douglas, female    DOB: [birth year], 57 y.o.   MRN: [id number] Chief Complaint  Patient presents with  . New Patient (Initial Visit)    varicose veins   Presents as a new patient self referred for evaluation of painful varicose veins.  The patient notes a long-standing history of varicose veins in the bilateral legs.  The patient was recently on a trip out of country which required a lot of walking and noticed intermittent bilateral calf cramping.  The patient states she is more sedentary when she does not travel and does not experience this claudication however with increased ambulation does note a discomfort.  The patient experiences pain, swelling and itching along her varicosities to the lower extremity.  The patient also experiences edema to the bilateral legs.  The patient notes that her symptoms worsen towards the end of the day.  The patient's symptoms have progressed to the point that she is unable to function on a daily basis.  Patient feels that her symptoms are lifestyle limiting.  At this time, the patient does not engage in conservative therapy including wearing medical grade 1 compression socks and elevating her legs.  The patient denies any recent surgery or trauma to the bilateral lower extremity.  Patient denies any fever, nausea vomiting.  Review of Systems  Constitutional: Negative.   HENT: Negative.   Eyes: Negative.   Respiratory: Negative.   Cardiovascular: Positive for leg swelling.       Intermittent calf claudication  Gastrointestinal: Negative.   Endocrine: Negative.   Genitourinary: Negative.   Musculoskeletal: Negative.   Skin: Negative.   Allergic/Immunologic: Negative.   Neurological: Negative.   Hematological: Negative.   Psychiatric/Behavioral: Negative.       Objective:   Physical Exam  Constitutional: She is oriented to person, place, and time. She appears well-developed and well-nourished. No distress.  HENT:    Head: Normocephalic and atraumatic.  Right Ear: External ear normal.  Left Ear: External ear normal.  Eyes: Pupils are equal, round, and reactive to light. Conjunctivae and EOM are normal.  Neck: Normal range of motion.  Cardiovascular: Normal rate, regular rhythm, normal heart sounds and intact distal pulses.  Pulses:      Radial pulses are 2+ on the right side, and 2+ on the left side.       Dorsalis pedis pulses are 1+ on the right side, and 1+ on the left side.       Posterior tibial pulses are 1+ on the right side, and 1+ on the left side.  Pulmonary/Chest: Effort normal and breath sounds normal.  Musculoskeletal: Normal range of motion. She exhibits edema (Mild bilateral nonpitting edema noted).  Neurological: She is alert and oriented to person, place, and time.  Skin: Skin is warm and dry. She is not diaphoretic.  Mixture of greater than and less than 1 cm varicosities noted to the bilateral lower extremity.  There is no stasis dermatitis, fibrosis, cellulitis or active ulcerations noted.  Psychiatric: She has a normal mood and affect. Her behavior is normal. Judgment and thought content normal.  Vitals reviewed.  BP 134/80 (BP Location: Right Arm)   Pulse 61   Resp 16   Ht  (1.549 m)   Wt 192 lb 3.2 oz (87.2 kg)   BMI 36.32 kg/m   Past Medical History:  Diagnosis Date  . Allergy     Social History   Socioeconomic History  . Marital  status: Single    Spouse name: Not on file  . Number of children: Not on file  . Years of education: Not on file  . Highest education level: Not on file  Occupational History  . Not on file  Social Needs  . Financial resource strain: Not on file  . Food insecurity:    Worry: Not on file    Inability: Not on file  . Transportation needs:    Medical: Not on file    Non-medical: Not on file  Tobacco Use  . Smoking status: Former Games developer  . Smokeless tobacco: Never Used  Substance and Sexual Activity  . Alcohol use: Yes     Frequency: Never  . Drug use: Not on file  . Sexual activity: Not on file  Lifestyle  . Physical activity:    Days per week: Not on file    Minutes per session: Not on file  . Stress: Not on file  Relationships  . Social connections:    Talks on phone: Not on file    Gets together: Not on file    Attends religious service: Not on file    Active member of club or organization: Not on file    Attends meetings of clubs or organizations: Not on file    Relationship status: Not on file  . Intimate partner violence:    Fear of current or ex partner: Not on file    Emotionally abused: Not on file    Physically abused: Not on file    Forced sexual activity: Not on file  Other Topics Concern  . Not on file  Social History Narrative  . Not on file   Past Surgical History:  Procedure Laterality Date  . CESAREAN SECTION    . FOOT SURGERY    . JOINT REPLACEMENT    . TUBAL LIGATION     Family History  Problem Relation Age of Onset  . Heart disease Mother   . Diabetes Mellitus II Father   . Diabetes Mellitus II Brother    Allergies  Allergen Reactions  . Morphine Hives    itching      Assessment & Plan:  Presents as a new patient self referred for evaluation of painful varicose veins.  The patient notes a long-standing history of varicose veins in the bilateral legs.  The patient was recently on a trip out of country which required a lot of walking and noticed intermittent bilateral calf cramping.  The patient states she is more sedentary when she does not travel and does not experience this claudication however with increased ambulation does note a discomfort.  The patient experiences pain, swelling and itching along her varicosities to the lower extremity.  The patient also experiences edema to the bilateral legs.  The patient notes that her symptoms worsen towards the end of the day.  The patient's symptoms have progressed to the point that she is unable to function on a daily  basis.  Patient feels that her symptoms are lifestyle limiting.  At this time, the patient does not engage in conservative therapy including wearing medical grade 1 compression socks and elevating her legs.  The patient denies any recent surgery or trauma to the bilateral lower extremity.  Patient denies any fever, nausea vomiting.  1. Varicose veins of both lower extremities with pain - New The patient was encouraged to wear graduated compression stockings (20-30 mmHg) on a daily basis. The patient was instructed to begin wearing the stockings first  thing in the morning and removing them in the evening. The patient was instructed specifically not to sleep in the stockings. Prescription given.  In addition, behavioral modification including elevation during the day will be initiated. Anti-inflammatories for pain. The patient will follow up in one months to asses conservative management.  We will bring the patient back at her convenience to undergo a venous duplex to rule out reflux Information on compression stockings was given to the patient. The patient was instructed to call the office in the interim if any worsening edema or ulcerations to the legs, feet or toes occurs. The patient expresses their understanding.  - VAS [place] LOWER EXTREMITY VENOUS REFLUX; Future  2. Claudication of calf muscles (HCC) - New Patient presents with claudication-like symptoms Faint pedal pulses on exam Will bring the patient back and have her undergo an ABI to rule out any contributing peripheral artery disease  - VAS [place] ABI WITH/WO TBI; Future  Current Outpatient Medications on File Prior to Visit  Medication Sig Dispense Refill  . docusate sodium (COLACE) 100 MG capsule Take 1 capsule (100 mg total) by mouth 2 (two) times daily. 10 capsule 0  . Multiple Vitamin (MULTI-VITAMINS) TABS Take by mouth.    . oxyCODONE (OXY IR/ROXICODONE) 5 MG immediate release tablet Take 1 tablet (5 mg total) by mouth every 4  (four) hours as needed for moderate pain or severe pain. 30 tablet 0  . vitamin B-12 (CYANOCOBALAMIN) 500 MCG tablet Take 500 mcg by mouth daily.    Marland Kitchen acetaminophen (TYLENOL) 325 MG tablet Take 2 tablets (650 mg total) by mouth every 6 (six) hours as needed for mild pain (or Fever >/= 101). (Patient not taking: Reported on 09/21/2017) 20 tablet 0  . oxyCODONE-acetaminophen (PERCOCET) 7.5-325 MG tablet Take 1 tablet by mouth every 4 (four) hours as needed for severe pain. (Patient not taking: Reported on 09/21/2017) 20 tablet 0   No current facility-administered medications on file prior to visit.    There are no Patient Instructions on file for this visit. No follow-ups on file.  KIMBERLY A STEGMAYER, PA-C

## 2017-12-29 ENCOUNTER — Encounter (INDEPENDENT_AMBULATORY_CARE_PROVIDER_SITE_OTHER): Payer: Self-pay | Admitting: Vascular Surgery

## 2017-12-29 ENCOUNTER — Ambulatory Visit (INDEPENDENT_AMBULATORY_CARE_PROVIDER_SITE_OTHER): Payer: BLUE CROSS/BLUE SHIELD | Admitting: Nurse Practitioner

## 2017-12-29 ENCOUNTER — Ambulatory Visit (INDEPENDENT_AMBULATORY_CARE_PROVIDER_SITE_OTHER): Payer: BLUE CROSS/BLUE SHIELD

## 2017-12-29 ENCOUNTER — Encounter

## 2017-12-29 VITALS — BP 145/74 | HR 52 | Resp 13 | Ht 61.0 in | Wt 196.0 lb

## 2017-12-29 DIAGNOSIS — I739 Peripheral vascular disease, unspecified: Secondary | ICD-10-CM | POA: Diagnosis not present

## 2017-12-29 DIAGNOSIS — I83813 Varicose veins of bilateral lower extremities with pain: Secondary | ICD-10-CM | POA: Diagnosis not present

## 2017-12-29 DIAGNOSIS — R030 Elevated blood-pressure reading, without diagnosis of hypertension: Secondary | ICD-10-CM

## 2017-12-30 ENCOUNTER — Encounter (INDEPENDENT_AMBULATORY_CARE_PROVIDER_SITE_OTHER): Payer: Self-pay | Admitting: Nurse Practitioner

## 2017-12-30 DIAGNOSIS — R030 Elevated blood-pressure reading, without diagnosis of hypertension: Secondary | ICD-10-CM | POA: Insufficient documentation

## 2017-12-30 NOTE — Progress Notes (Signed)
Subjective:    Patient ID: Angel Douglas, female    DOB: Jun 09, 1960, 57 y.o.   MRN: [id number] Chief Complaint  Patient presents with  . Follow-up    ABI and Bilateral Venous U/S follow up    HPI  Angel Douglas is a 57 y.o. female who presents The patient returns for followup evaluation 3 months after the initial visit. The patient continues to have pain in the lower extremities with dependency. The pain is lessened with elevation. Graduated compression stockings, Class I (20-30 mmHg), have been worn but the stockings do not eliminate the leg pain. Over-the-counter analgesics do not improve the symptoms. The degree of discomfort continues to interfere with daily activities. The patient notes the pain in the legs is causing problems with daily exercise, at the workplace and even with household activities and maintenance such as standing in the kitchen preparing meals and doing dishes.   Venous ultrasound shows normal no evidence of acute DVT or superficial venous thrombosis in either bilateral extremity.  The patient has right leg reflux in the great saphenous vein at the knee and distally.  The the left lower extremity has reflux in the popliteal vein and superficial femoral junction.  The patient also underwent ABIs to rule out any arterial disease the patient's ABI on the right was 1.06, with 1.17 on the left.  The flows were triphasic in both legs.  Constitutional: [] Weight loss  [] Fever  [] Chills Cardiac: [] Chest pain   [] Chest pressure   [] Palpitations   [] Shortness of breath when laying flat   [] Shortness of breath with exertion. Vascular:  [x] Pain in legs with walking   [x] Pain in legs with standing  [] History of DVT   [] Phlebitis   [] Swelling in legs   [x] Varicose veins   [] Non-healing ulcers Pulmonary:   [] Uses home oxygen   [] Productive cough   [] Hemoptysis   [] Wheeze  [] COPD   [] Asthma Neurologic:  [] Dizziness   [] Seizures   [] History of stroke   [] History of TIA  [] Aphasia    [] Vissual changes   [] Weakness or numbness in arm   [] Weakness or numbness in leg Musculoskeletal:   [] Joint swelling   [] Joint pain   [] Low back pain Hematologic:  [] Easy bruising  [] Easy bleeding   [] Hypercoagulable state   [] Anemic Gastrointestinal:  [] Diarrhea   [] Vomiting  [] Gastroesophageal reflux/heartburn   [] Difficulty swallowing. Genitourinary:  [] Chronic kidney disease   [] Difficult urination  [] Frequent urination   [] Blood in urine Skin:  [] Rashes   [] Ulcers  Psychological:  [] History of anxiety   []  History of major depression.     Objective:   Physical Exam  BP (!) 145/74 (BP Location: Right Arm, Patient Position: Sitting)   Pulse (!) 52   Resp 13   Ht 5\' 1"  (1.549 m)   Wt 196 lb (88.9 kg)   BMI 37.03 kg/m   Past Medical History:  Diagnosis Date  . Allergy      Gen: WD/WN, NAD Head: [place]/AT, No temporalis wasting.  Ear/Nose/Throat: Hearing grossly intact, nares w/o erythema or drainage Eyes: PER, EOMI, sclera nonicteric.  Neck: Supple, no masses.  No JVD.  Pulmonary:  Good air movement, no use of accessory muscles.  Cardiac: RRR Vascular:  Multiple scattered varicosities on bilateral extremities, left leg is larger than the right Vessel Right Left  PT  palpable  palpable  Gastrointestinal: soft, non-distended. No guarding/no peritoneal signs.  Musculoskeletal: M/S 5/5 throughout.  No deformity or atrophy.  Neurologic: Pain and light  touch intact in extremities.  Symmetrical.  Speech is fluent. Motor exam as listed above. Psychiatric: Judgment intact, Mood & affect appropriate for pt's clinical situation. Dermatologic: No Venous rashes. No Ulcers Noted.  No changes consistent with cellulitis. Lymph : No Cervical lymphadenopathy, no lichenification or skin changes of chronic lymphedema.   Social History   Socioeconomic History  . Marital status: Single    Spouse name: Not on file  . Number of children: Not on file  . Years of education: Not on file  .  Highest education level: Not on file  Occupational History  . Not on file  Social Needs  . Financial resource strain: Not on file  . Food insecurity:    Worry: Not on file    Inability: Not on file  . Transportation needs:    Medical: Not on file    Non-medical: Not on file  Tobacco Use  . Smoking status: Former Games developer  . Smokeless tobacco: Never Used  Substance and Sexual Activity  . Alcohol use: Yes    Frequency: Never  . Drug use: Not on file  . Sexual activity: Not on file  Lifestyle  . Physical activity:    Days per week: Not on file    Minutes per session: Not on file  . Stress: Not on file  Relationships  . Social connections:    Talks on phone: Not on file    Gets together: Not on file    Attends religious service: Not on file    Active member of club or organization: Not on file    Attends meetings of clubs or organizations: Not on file    Relationship status: Not on file  . Intimate partner violence:    Fear of current or ex partner: Not on file    Emotionally abused: Not on file    Physically abused: Not on file    Forced sexual activity: Not on file  Other Topics Concern  . Not on file  Social History Narrative  . Not on file    Past Surgical History:  Procedure Laterality Date  . CESAREAN SECTION    . FOOT SURGERY    . JOINT REPLACEMENT    . TUBAL LIGATION      Family History  Problem Relation Age of Onset  . Heart disease Mother   . Diabetes Mellitus II Father   . Diabetes Mellitus II Brother     Allergies  Allergen Reactions  . Morphine Hives    itching       Assessment & Plan:   1. Varicose veins of both lower extremities with pain Recommend  I have reviewed my previous  discussion with the patient regarding  varicose veins and why they cause symptoms. Patient will continue  wearing graduated compression stockings class 1 on a daily basis, beginning first thing in the morning and removing them in the evening.    In addition,  behavioral modification including elevation during the day was again discussed and this will continue.  The patient has utilized over the counter pain medications and has been exercising.  However, at this time conservative therapy has not alleviated the patient's symptoms of leg pain and swelling  Recommend: laser ablation of the right and  eliminate the symptoms of pain and swelling of the right lower extremity caused by the severe superficial venous reflux disease.   The risk benefits and alternatives have been discussed with the patient.  The patient agrees to proceed with laser  ablation on her right lower extremity.  The patient also has significantly painful and large varicosities of her left lower extremity, however she is not a candidate for endovenous laser ablation of her great saphenous vein.  I feel strongly that the patient would benefit from sclerotherapy of her left lower extremity.   2. Claudication of calf muscles (HCC) The patient also underwent ABIs to rule out any arterial disease the patient's ABI on the right was 1.06, with 1.17 on the left.  The flows were triphasic in both legs.  Based on these results it is unlikely that the claudication of the calf muscles is due to peripheral vascular disease.  3. Elevated BP without diagnosis of hypertension Of note the patient's blood pressure was elevated today at 145/74, this could be an isolated event however if he continues to be elevated on subsequent appointments it should be addressed by a primary care provider.  Current Outpatient Medications on File Prior to Visit  Medication Sig Dispense Refill  . docusate sodium (COLACE) 100 MG capsule Take 1 capsule (100 mg total) by mouth 2 (two) times daily. 10 capsule 0  . Multiple Vitamin (MULTI-VITAMINS) TABS Take by mouth.    . vitamin B-12 (CYANOCOBALAMIN) 500 MCG tablet Take 500 mcg by mouth daily.    Marland Kitchen acetaminophen (TYLENOL) 325 MG tablet Take 2 tablets (650 mg total) by  mouth every 6 (six) hours as needed for mild pain (or Fever >/= 101). (Patient not taking: Reported on 09/21/2017) 20 tablet 0  . oxyCODONE-acetaminophen (PERCOCET) 7.5-325 MG tablet Take 1 tablet by mouth every 4 (four) hours as needed for severe pain. (Patient not taking: Reported on 09/21/2017) 20 tablet 0   No current facility-administered medications on file prior to visit.     There are no Patient Instructions on file for this visit. No follow-ups on file.   Georgiana Spinner, NP

## 2018-02-25 ENCOUNTER — Encounter (INDEPENDENT_AMBULATORY_CARE_PROVIDER_SITE_OTHER): Payer: Self-pay | Admitting: Vascular Surgery

## 2018-02-25 ENCOUNTER — Ambulatory Visit (INDEPENDENT_AMBULATORY_CARE_PROVIDER_SITE_OTHER): Payer: BLUE CROSS/BLUE SHIELD | Admitting: Vascular Surgery

## 2018-02-25 VITALS — BP 126/77 | HR 60 | Ht 61.0 in | Wt 198.0 lb

## 2018-02-25 DIAGNOSIS — I83813 Varicose veins of bilateral lower extremities with pain: Secondary | ICD-10-CM

## 2018-02-25 NOTE — Progress Notes (Signed)
Varicose veins of both lower extremities with pain     The patient's right lower extremity was sterilely prepped and draped. The ultrasound machine was used to visualize the saphenous vein throughout its course. A segment in the upper calf was selected for access. The saphenous vein was accessed without difficulty using ultrasound guidance with a micro puncture needle. A micro puncture wire and sheath were then placed. A 0.018 wire was placed beyond the saphenofemoral junction through the sheath and the micro puncture sheath was removed. The 65 cm sheath was then placed over the wire and the wire and dilator were removed. The laser fiber was placed through the sheath and its tip was placed approximately 4-5 cm below the saphenofemoral junction. Tumescent anesthesia was then created with a dilute lidocaine solution. Laser energy was then delivered with constant withdrawal of the sheath and laser fiber. Approximately 1157 Joules of energy were delivered over a length of 31 cm using the 1470 Hz [employer] machine at [employer]. Sterile dressings were placed. The patient tolerated the procedure well without complications.

## 2018-02-28 ENCOUNTER — Other Ambulatory Visit (INDEPENDENT_AMBULATORY_CARE_PROVIDER_SITE_OTHER): Payer: BLUE CROSS/BLUE SHIELD

## 2018-02-28 DIAGNOSIS — I83813 Varicose veins of bilateral lower extremities with pain: Secondary | ICD-10-CM

## 2018-03-25 ENCOUNTER — Ambulatory Visit (INDEPENDENT_AMBULATORY_CARE_PROVIDER_SITE_OTHER): Payer: BLUE CROSS/BLUE SHIELD | Admitting: Nurse Practitioner

## 2018-03-28 ENCOUNTER — Ambulatory Visit (INDEPENDENT_AMBULATORY_CARE_PROVIDER_SITE_OTHER): Payer: BLUE CROSS/BLUE SHIELD | Admitting: Nurse Practitioner

## 2019-01-23 ENCOUNTER — Encounter: Payer: BLUE CROSS/BLUE SHIELD | Admitting: Obstetrics and Gynecology

## 2019-05-19 IMAGING — MR MR FEMUR*R* W/O CM
5 series · 39 of 40 positions shown · non-contrast
Comparison: Radiographs from 05/19/2017

CLINICAL DATA: Fall with right leg pain.

EXAM:
MRI OF THE RIGHT FEMUR WITHOUT CONTRAST
TECHNIQUE: Multiplanar, multisequence MR imaging of the right femur was
performed. No intravenous contrast was administered.

[Series 4: T1 · coronal · 4.0mm · 1.72mm/px · 7 of 40 slices shown (1 of 2)]
[im 1/40]
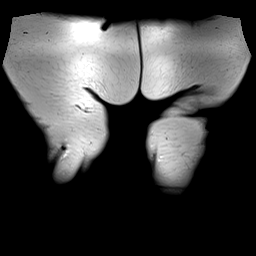
[im 7/40]
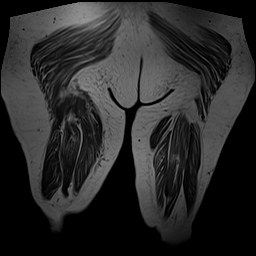
[im 14/40]
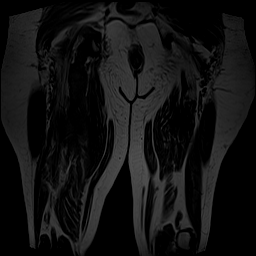
[im 20/40]
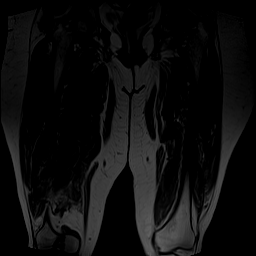
[im 27/40]
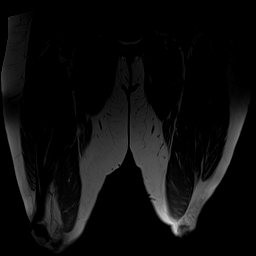
[im 33/40]
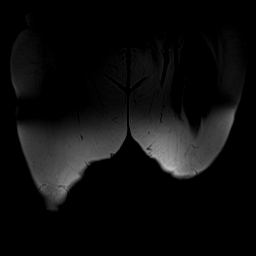
[im 40/40]
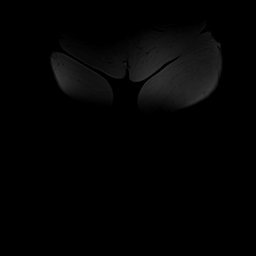

[Series 5: STIR · coronal · 4.0mm · 0.86mm/px · 5 of 40 slices shown]
[im 1/40]
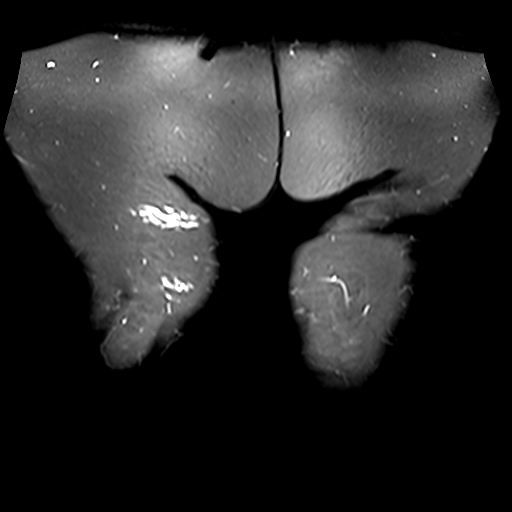
[im 8/40]
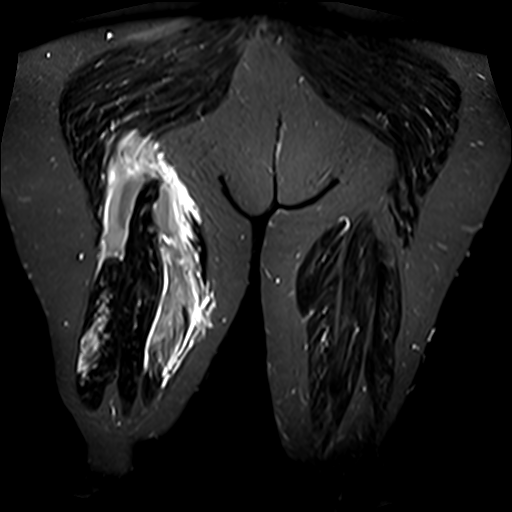
[im 16/40]
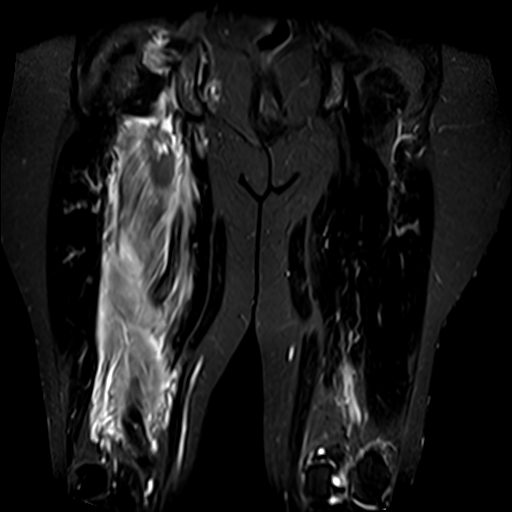
[im 24/40]
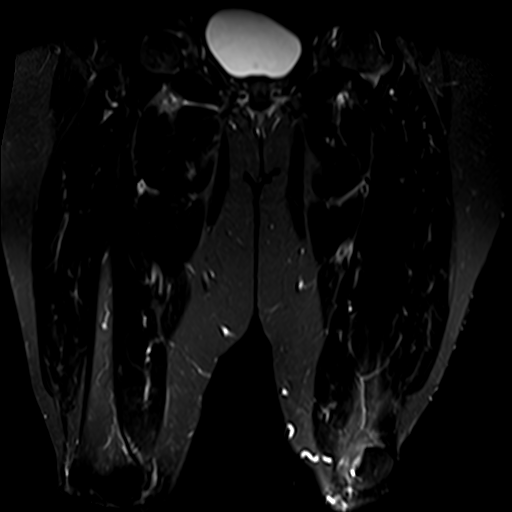
[im 32/40]
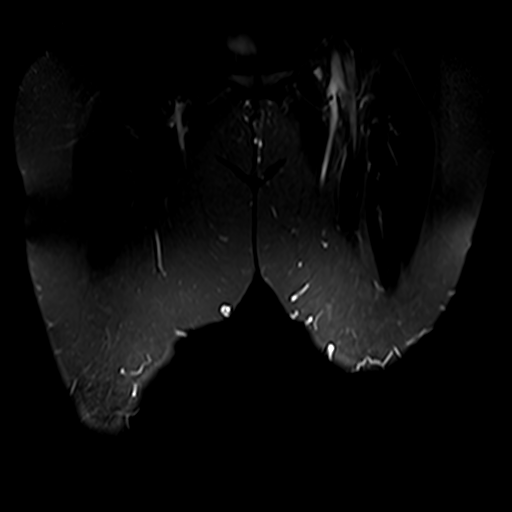

[Series 6: T1 · axial · 5.0mm · 1.72mm/px · z∈[-239,+192]mm · 11 of 70 slices shown (2 of 2)]
[im 1/70]
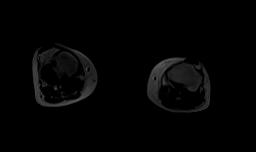
[im 7/70]
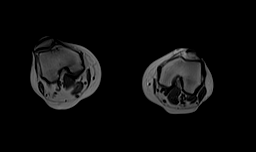
[im 14/70]
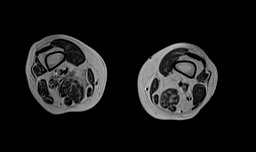
[im 21/70]
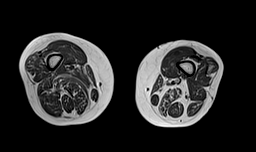
[im 28/70]
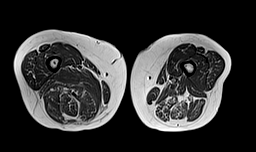
[im 35/70]
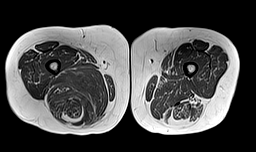
[im 42/70]
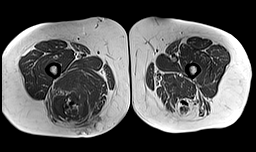
[im 49/70]
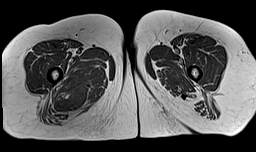
[im 56/70]
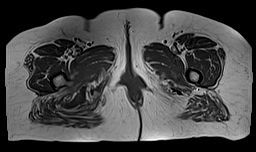
[im 63/70]
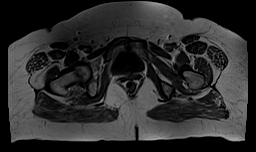
[im 70/70]
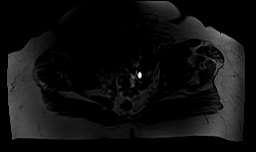

[Series 7: T2 fat-sat · axial · 5.0mm · 0.86mm/px · z∈[-239,+192]mm · 11 of 70 slices shown (1 of 2)]
[im 1/70]
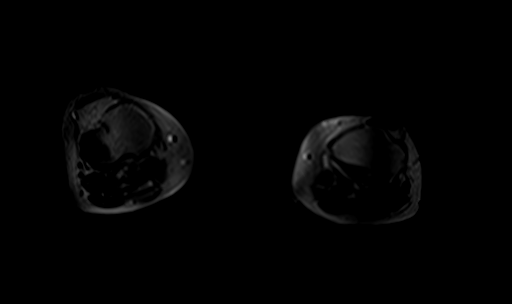
[im 7/70]
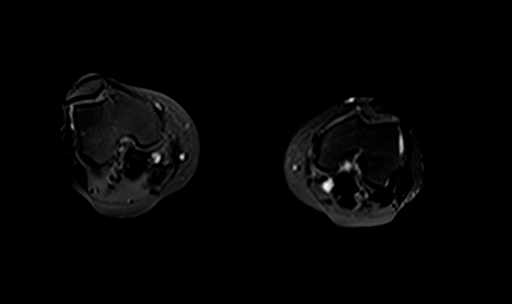
[im 14/70]
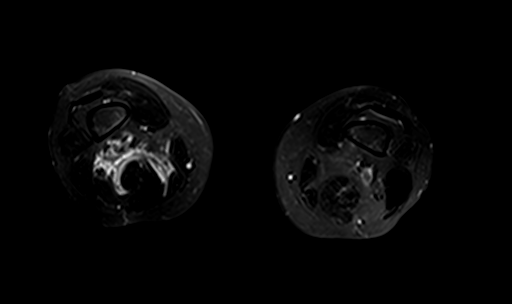
[im 21/70]
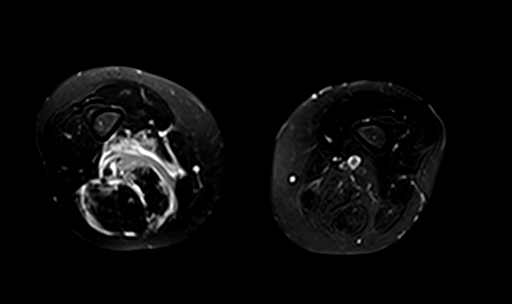
[im 28/70]
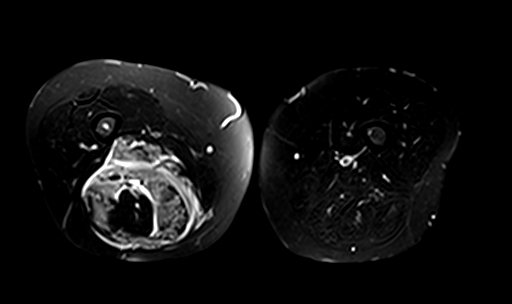
[im 35/70]
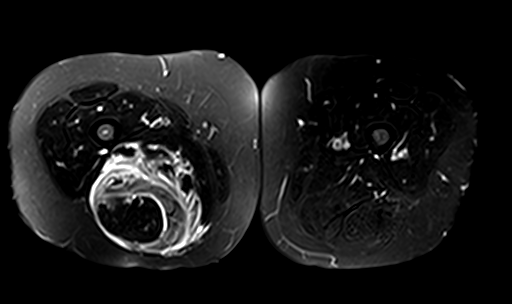
[im 42/70]
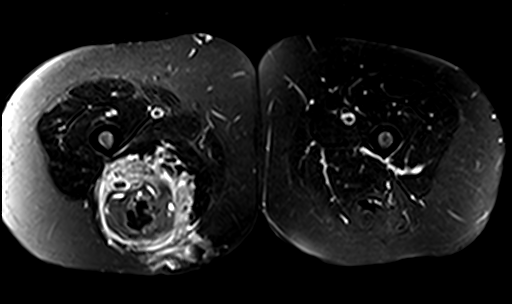
[im 49/70]
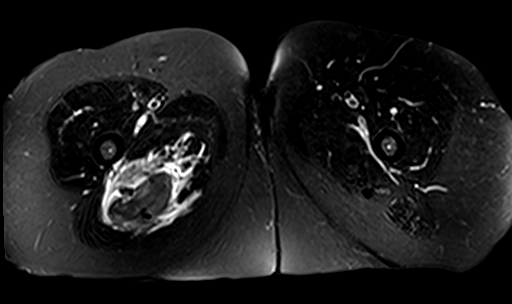
[im 56/70]
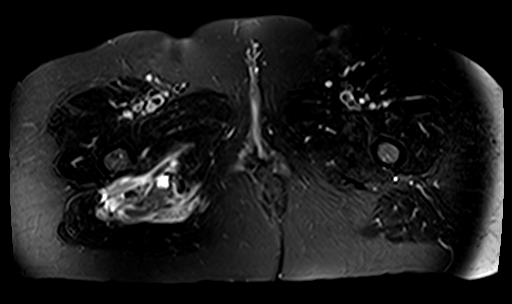
[im 63/70]
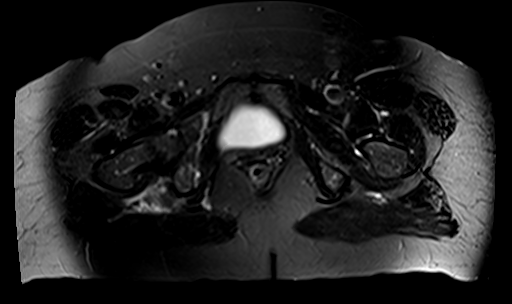
[im 70/70]
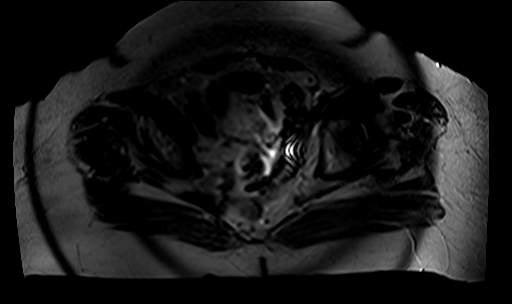

[Series 8: T2 fat-sat · sagittal · 5.0mm · 0.86mm/px · 5 of 33 slices shown (2 of 2)]
[im 1/33]
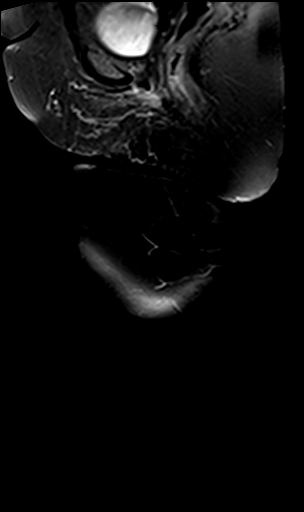
[im 9/33]
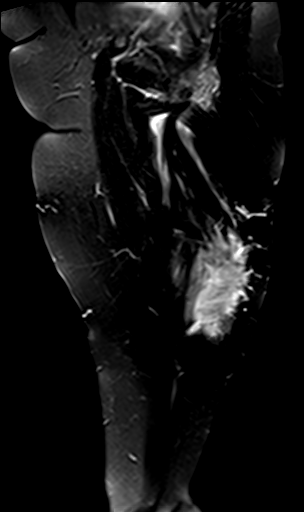
[im 17/33]
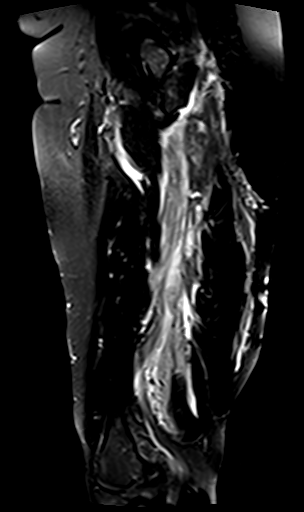
[im 25/33]
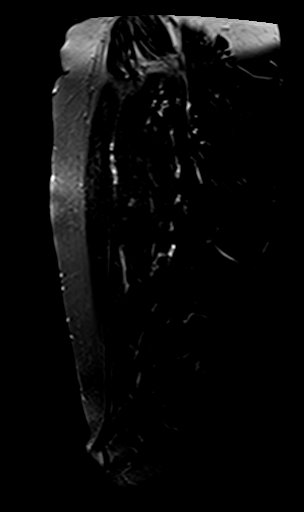
[im 33/33]
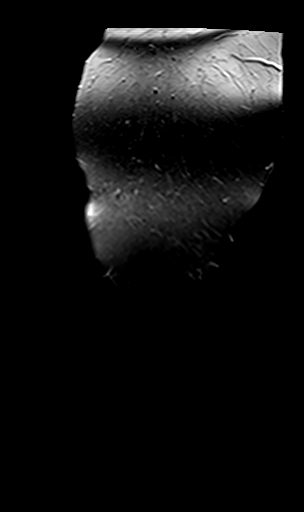

[39 of 40 positions shown; findings below may reference images not displayed]

FINDINGS: Bones/Joint/Cartilage

No significant marrow edema, questionable slight cortical avulsion
along the right ischial avulsion site.

Ligaments

N/A

Muscles and Tendons

Right hamstring avulsion with the semitendinosus tendon retracted
about 5.5 cm and the semimembranosus retracted up to 14 cm distally.
Indistinct biceps femoris muscle. There is considerable hematoma
along the avulsion gap and tracking around the proximal margins of
the musculature with considerable regional edema which extends to
involve the lower portion of the adjacent hip adductor musculature
and tissue planes around the affect muscles. The abnormal edema in
the adductor magnus muscle on the right may indicate a muscle
strain/tear.

Soft tissues

The regional edema and hematoma related to the avulsion of the right
hamstrings surrounds a significant portion of the sciatic nerve in
the right lower extremity. No impingement at the sciatic notch.

Note is made of small bilateral Baker's cysts.
IMPRESSION: 1. Acute right hamstring avulsion with considerable distal
retraction of the hamstring musculotendinous structures, intervening
hematoma, and extensive edema. Some of this edema tracks around the
sciatic nerve in the right thigh.
2. Muscle strain/tear of the right adductor magnus muscle.
3. Small bilateral Baker's cysts.

## 2023-10-15 ENCOUNTER — Other Ambulatory Visit: Payer: Self-pay

## 2023-10-15 ENCOUNTER — Emergency Department

## 2023-10-15 ENCOUNTER — Encounter: Payer: Self-pay | Admitting: Intensive Care

## 2023-10-15 ENCOUNTER — Inpatient Hospital Stay
Admission: EM | Admit: 2023-10-15 | Discharge: 2023-10-18 | DRG: 282 | Disposition: A | Discharge status: Home / Self Care | Attending: Student | Admitting: Student

## 2023-10-15 DIAGNOSIS — I2510 Atherosclerotic heart disease of native coronary artery without angina pectoris: Secondary | ICD-10-CM | POA: Diagnosis present

## 2023-10-15 DIAGNOSIS — F32A Depression, unspecified: Secondary | ICD-10-CM | POA: Diagnosis present

## 2023-10-15 DIAGNOSIS — Z602 Problems related to living alone: Secondary | ICD-10-CM | POA: Diagnosis present

## 2023-10-15 DIAGNOSIS — Z87891 Personal history of nicotine dependence: Secondary | ICD-10-CM | POA: Diagnosis not present

## 2023-10-15 DIAGNOSIS — Z8249 Family history of ischemic heart disease and other diseases of the circulatory system: Secondary | ICD-10-CM

## 2023-10-15 DIAGNOSIS — Z7982 Long term (current) use of aspirin: Secondary | ICD-10-CM | POA: Diagnosis not present

## 2023-10-15 DIAGNOSIS — E66811 Obesity, class 1: Secondary | ICD-10-CM | POA: Diagnosis present

## 2023-10-15 DIAGNOSIS — I10 Essential (primary) hypertension: Secondary | ICD-10-CM | POA: Diagnosis present

## 2023-10-15 DIAGNOSIS — Z833 Family history of diabetes mellitus: Secondary | ICD-10-CM | POA: Diagnosis not present

## 2023-10-15 DIAGNOSIS — Z885 Allergy status to narcotic agent status: Secondary | ICD-10-CM | POA: Diagnosis not present

## 2023-10-15 DIAGNOSIS — Z6830 Body mass index (BMI) 30.0-30.9, adult: Secondary | ICD-10-CM | POA: Diagnosis not present

## 2023-10-15 DIAGNOSIS — R079 Chest pain, unspecified: Secondary | ICD-10-CM | POA: Diagnosis present

## 2023-10-15 DIAGNOSIS — Z825 Family history of asthma and other chronic lower respiratory diseases: Secondary | ICD-10-CM

## 2023-10-15 DIAGNOSIS — Z9660 Presence of unspecified orthopedic joint implant: Secondary | ICD-10-CM | POA: Diagnosis present

## 2023-10-15 DIAGNOSIS — Z79899 Other long term (current) drug therapy: Secondary | ICD-10-CM | POA: Diagnosis not present

## 2023-10-15 DIAGNOSIS — E785 Hyperlipidemia, unspecified: Secondary | ICD-10-CM | POA: Diagnosis present

## 2023-10-15 DIAGNOSIS — I214 Non-ST elevation (NSTEMI) myocardial infarction: Secondary | ICD-10-CM | POA: Diagnosis present

## 2023-10-15 HISTORY — DX: Unspecified pre-eclampsia, unspecified trimester: O14.90

## 2023-10-15 HISTORY — DX: Depression, unspecified: F32.A

## 2023-10-15 HISTORY — DX: Essential (primary) hypertension: I10

## 2023-10-15 LAB — CBC
HCT: 37.9 % (ref 36.0–46.0)
Hemoglobin: 13.4 g/dL (ref 12.0–15.0)
MCH: 32.4 pg (ref 26.0–34.0)
MCHC: 35.4 g/dL (ref 30.0–36.0)
MCV: 91.8 fL (ref 80.0–100.0)
Platelets: 232 10*3/uL (ref 150–400)
RBC: 4.13 MIL/uL (ref 3.87–5.11)
RDW: 12.6 % (ref 11.5–15.5)
WBC: 6.8 10*3/uL (ref 4.0–10.5)
nRBC: 0 % (ref 0.0–0.2)

## 2023-10-15 LAB — BASIC METABOLIC PANEL WITH GFR
Anion gap: 5 (ref 5–15)
BUN: 10 mg/dL (ref 8–23)
CO2: 26 mmol/L (ref 22–32)
Calcium: 9.7 mg/dL (ref 8.9–10.3)
Chloride: 109 mmol/L (ref 98–111)
Creatinine, Ser: 0.49 mg/dL (ref 0.44–1.00)
GFR, Estimated: >60 mL/min (ref >60)
Glucose, Bld: 88 mg/dL (ref 70–99)
Potassium: 4.2 mmol/L (ref 3.5–5.1)
Sodium: 140 mmol/L (ref 135–145)

## 2023-10-15 LAB — PROTIME-INR
INR: 1 (ref 0.8–1.2)
Prothrombin Time: 13.1 s (ref 11.4–15.2)

## 2023-10-15 LAB — HEPARIN LEVEL (UNFRACTIONATED): Heparin Unfractionated: 0.17 [IU]/mL — ABNORMAL LOW (ref 0.30–0.70)

## 2023-10-15 LAB — APTT: aPTT: 27 s (ref 24–36)

## 2023-10-15 LAB — TROPONIN I (HIGH SENSITIVITY)
Troponin I (High Sensitivity): 96 ng/L — ABNORMAL HIGH (ref <18)
Troponin I (High Sensitivity): 310 ng/L (ref <18)
Troponin I (High Sensitivity): 345 ng/L (ref <18)

## 2023-10-15 MED ORDER — ASPIRIN 81 MG PO CHEW
324.0000 mg | CHEWABLE_TABLET | Freq: Once | ORAL | Status: AC
  Administered 2023-10-15: 324 mg via ORAL
  Filled 2023-10-15: qty 4

## 2023-10-15 MED ORDER — SODIUM CHLORIDE 0.9% FLUSH
3.0000 mL | Freq: Two times a day (BID) | INTRAVENOUS | Status: DC
  Administered 2023-10-16 – 2023-10-17 (×3): 3 mL via INTRAVENOUS

## 2023-10-15 MED ORDER — NITROGLYCERIN 0.4 MG SL SUBL: 0.4000 mg | SUBLINGUAL_TABLET | SUBLINGUAL | Status: DC | PRN

## 2023-10-15 MED ORDER — HEPARIN BOLUS VIA INFUSION
3200.0000 [IU] | Freq: Once | INTRAVENOUS | Status: AC
  Administered 2023-10-15: 3200 [IU] via INTRAVENOUS
  Filled 2023-10-15: qty 3200

## 2023-10-15 MED ORDER — ONDANSETRON HCL 4 MG/2ML IJ SOLN: 4.0000 mg | Freq: Four times a day (QID) | INTRAMUSCULAR | Status: DC | PRN

## 2023-10-15 MED ORDER — SODIUM CHLORIDE 0.9% FLUSH: 3.0000 mL | INTRAVENOUS | Status: DC | PRN

## 2023-10-15 MED ORDER — SODIUM CHLORIDE 0.9% FLUSH
3.0000 mL | Freq: Two times a day (BID) | INTRAVENOUS | Status: DC
  Administered 2023-10-15 – 2023-10-17 (×4): 3 mL via INTRAVENOUS

## 2023-10-15 MED ORDER — SODIUM CHLORIDE 0.9 % IV SOLN: 250.0000 mL | INTRAVENOUS | Status: AC | PRN

## 2023-10-15 MED ORDER — METOPROLOL TARTRATE 25 MG PO TABS
12.5000 mg | ORAL_TABLET | Freq: Two times a day (BID) | ORAL | Status: DC
  Administered 2023-10-15 – 2023-10-18 (×6): 12.5 mg via ORAL
  Filled 2023-10-15 (×7): qty 1

## 2023-10-15 MED ORDER — HEPARIN (PORCINE) 25000 UT/250ML-% IV SOLN
900.0000 [IU]/h | INTRAVENOUS | Status: DC
  Administered 2023-10-15: 650 [IU]/h via INTRAVENOUS
  Administered 2023-10-16 – 2023-10-18 (×2): 850 [IU]/h via INTRAVENOUS
  Filled 2023-10-15 (×3): qty 250

## 2023-10-15 MED ORDER — ATORVASTATIN CALCIUM 20 MG PO TABS
20.0000 mg | ORAL_TABLET | Freq: Every day | ORAL | Status: DC
Start: 2023-10-15 — End: 2023-10-18
  Administered 2023-10-16 – 2023-10-17 (×2): 20 mg via ORAL
  Filled 2023-10-15 (×2): qty 1

## 2023-10-15 MED ORDER — ASPIRIN 81 MG PO TBEC
81.0000 mg | DELAYED_RELEASE_TABLET | Freq: Every day | ORAL | Status: DC
  Administered 2023-10-16 – 2023-10-17 (×2): 81 mg via ORAL
  Filled 2023-10-15 (×3): qty 1

## 2023-10-15 MED ORDER — ACETAMINOPHEN 325 MG PO TABS
650.0000 mg | ORAL_TABLET | ORAL | Status: DC | PRN
  Administered 2023-10-16: 650 mg via ORAL
  Filled 2023-10-15: qty 2

## 2023-10-15 NOTE — ED Notes (Signed)
Hospitalist in with patient.

## 2023-10-15 NOTE — ED Notes (Signed)
 Pt asked why she had to take metoprolol if she already took her amlodipine in the morning.  This paramedic informed the pt she would message her attending.  Pt left upstairs prior to informing her of the MD response.

## 2023-10-15 NOTE — ED Notes (Signed)
 ED Provider at bedside.

## 2023-10-15 NOTE — ED Triage Notes (Signed)
 Patient c/o intermittent chest pain that started Wednesday. Also c/o radiation to jaw. Reports tightness and sharp pains.

## 2023-10-15 NOTE — Consult Note (Signed)
 CARDIOLOGY CONSULT NOTE               Patient ID: Angel Douglas MRN: [id number] DOB/AGE: 1960/06/24 63 y.o.  Admit date: 10/15/2023 Referring Physician Dr. Lorita Douglas hospitalist Primary Physician Dr. Mat Douglas primary Primary Cardiologist  Reason for Consultation unstable angina possible non-STEMI  HPI: 63 year old Hispanic female history of hypertension mild depression Works at the transfer center presented with chest pain starting over the last few days patient states Wednesday the pain was worse but she continue with her normal activity but Returning with any moderate activity pain radiated from her chest to her jaw was substernal and required her to rest for it to go away she tried this several times but the day of admission the pain came and would not go away worse at worst was 8 out of 10 today is milder but relatively persistent no previous cardiac history but had a significant family history patient also hypertensive and is on amlodipine.  Patient here for further evaluation with elevated troponins and anginal symptoms  Review of systems complete and found to be negative unless listed above     Past Medical History:  Diagnosis Date   Allergy    Depression    Hypertension    Preeclampsia     Past Surgical History:  Procedure Laterality Date   CESAREAN SECTION     FOOT SURGERY     JOINT REPLACEMENT     TUBAL LIGATION      (Not in a hospital admission)  Social History   Socioeconomic History   Marital status: Married    Spouse name: Not on file   Number of children: Not on file   Years of education: Not on file   Highest education level: Not on file  Occupational History   Occupation: Production designer, theatre/television/film    Comment: Hydrologist [employer]  Tobacco Use   Smoking status: Former    Types: Cigarettes   Smokeless tobacco: Never   Tobacco comments:    Quit 25ya, 15 pack year  Vaping Use   Vaping status: Never Used  Substance and Sexual  Activity   Alcohol use: Yes    Comment: occ   Drug use: Never   Sexual activity: Not on file  Other Topics Concern   Not on file  Social History Narrative   Not on file   Social Drivers of Health   Financial Resource Strain: Not on file  Food Insecurity: Not on file  Transportation Needs: Not on file  Physical Activity: Not on file  Stress: Not on file  Social Connections: Not on file  Intimate Partner Violence: Not on file    Family History  Problem Relation Age of Onset   Heart disease Mother 39       her first mini heart attack   COPD Mother    Diabetes Mellitus II Father    Diabetes Mellitus II Brother       Review of systems complete and found to be negative unless listed above      PHYSICAL EXAM  General: Well developed, well nourished, in no acute distress HEENT:  Normocephalic and atramatic Neck:  No JVD.  Lungs: Clear bilaterally to auscultation and percussion. Heart: HRRR . Normal S1 and S2 without gallops or murmurs.  Abdomen: Bowel sounds are positive, abdomen soft and non-tender  Msk:  Back normal, normal gait. Normal strength and tone for age. Extremities: No clubbing, cyanosis or edema.   Neuro: Alert and  oriented X 3. Psych:  Good affect, responds appropriately  Labs:   Lab Results  Component Value Date   WBC 6.8 10/15/2023   HGB 13.4 10/15/2023   HCT 37.9 10/15/2023   MCV 91.8 10/15/2023   PLT 232 10/15/2023    Recent Labs  Lab 10/15/23 1337  NA 140  K 4.2  CL 109  CO2 26  BUN 10  CREATININE 0.49  CALCIUM 9.7  GLUCOSE 88   No results found for: CKTOTAL, CKMB, CKMBINDEX, TROPONINI No results found for: CHOL No results found for: HDL No results found for: LDLCALC No results found for: TRIG No results found for: CHOLHDL No results found for: LDLDIRECT    Radiology: DG Chest 2 View Result Date: 10/15/2023 CLINICAL DATA:  Intermittent chest pain starting Wednesday. EXAM: CHEST - 2 VIEW COMPARISON:  None  Available. FINDINGS: Cardiac silhouette and mediastinal contours are within normal limits. The lungs are clear. No pleural effusion or pneumothorax. Moderate multilevel disc space narrowing, endplate sclerosis, and peripheral osteophytosis of the thoracic spine. Minimal dextrocurvature of the upper lumbar spine. IMPRESSION: No active cardiopulmonary disease. Electronically Signed   By: Bertina Broccoli M.D.   On: 10/15/2023 15:54    EKG: Normal sinus rhythm nonspecific ST-T changes rate of 70  ASSESSMENT AND PLAN:  Possible non-STEMI Elevated troponins Unstable angina Hypertension Ex smoker Family history of arteriosclerotic vascular disease . Plan Agreed admit to telemetry follow-up troponins EKGs and telemetry Heparin for anticoagulation for possible ACS non-STEMI Echocardiogram for evaluation of left ventricular function valvular structures Aspirin daily institute statin therapy low-dose beta-blockers possibly ACE or ARB Consider invasive evaluation prior to discharge   Signed: Antonette Batters MD 10/15/2023, 6:39 PM

## 2023-10-15 NOTE — ED Notes (Signed)
 CCMD contacted for patient monitoring.

## 2023-10-15 NOTE — ED Notes (Signed)
 Patient up ambulatory to bathroom. Fluids provided to patient and spouse at the bedside.

## 2023-10-15 NOTE — Consult Note (Signed)
 PHARMACY - ANTICOAGULATION CONSULT NOTE  Pharmacy Consult for Heparin Indication: chest pain/ACS  Allergies  Allergen Reactions   Morphine  Hives    itching    Patient Measurements: Height: 4' 9 (144.8 cm) Weight: 63.5 kg (140 lb) IBW/kg (Calculated) : 38.6 HEPARIN DW (KG): 52.8  Vital Signs: Temp: 98.5 F (36.9 C) (06/20 1336) Temp Source: Oral (06/20 1336) BP: 129/78 (06/20 1530) Pulse Rate: 67 (06/20 1530)  Labs: Recent Labs    10/15/23 1337 10/15/23 1550  HGB 13.4  --   HCT 37.9  --   PLT 232  --   APTT 27  --   LABPROT 13.1  --   INR 1.0  --   CREATININE 0.49  --   TROPONINIHS 96* 310*    Estimated Creatinine Clearance: 55.9 mL/min (by C-G formula based on SCr of 0.49 mg/dL).   Medical History: Past Medical History:  Diagnosis Date   Allergy    Depression    Hypertension    Preeclampsia     Medications:  No history of chronic anticoagulant use PTA  Assessment: 63 y.o. female past medical history significant for hypertension, depression, who presents to the emergency department with chest pain.  Chest pain started on Wednesday while she was at rest, she went to lay down and her chest pain resolved. Troponin levels 96-->310, Pharmacy has been consulted to initiate and dose continuous heparin infusion.  Baseline labs: aPTT 27 sec, INR 1.0, Plts 232, Hgb 13.4  Goal of Therapy:  Heparin level 0.3-0.7 units/ml Monitor platelets by anticoagulation protocol: Yes   Plan:  Give 3200 units bolus x 1 Start heparin infusion at 650 units/hr Check anti-Xa level in 6 hours and daily while on heparin Continue to monitor H&H and platelets  Angel Douglas A Sisto Granillo 10/15/2023,4:54 PM

## 2023-10-15 NOTE — H&P (Signed)
 History and Physical    Patient: Angel Douglas UJW:[id number] DOB: 07-Nov-1960 DOA: 10/15/2023 DOS: the patient was seen and examined on 10/15/2023 PCP: Lorina Roosevelt, MD  Patient coming from: Home - lives alone; NOK: Daughter, Angel Douglas, [phone]   Chief Complaint: chest pain  HPI: Angel Douglas is a 63 y.o. female with medical history significant of HTN and depression who presented on 6/20 with chest pain.   One of her dogs died on Nov 08, 2023 and she dug the hole and buried the dog.  That night, her legs were very tired and sore.  Wednesday night, she developed teeth pain, jaw pain, and substernal CP. She went upstairs and laid down and it didn't go away.  She took a hot shower with some improvement and she laid down again.  Thursday she was fine so she pushed a lawnmower and the pain recurred.  She stopped and rested and then mowed again without difficulty.  She was fine today and then got the pain again today while walking the dog.  She went to work anyway, pain coming and going and so she decided to some in for evaluation.  It was never as strong as Wednesday night.  She went to the walk-in clinic and they told her to come in and she left to go back to work but her daughter made her come.      ER Course:  ACS.  Exertional CP since Wednesday.  None now.  Troponin 95, 310.  EKG ok, repeat also ok.  Given ASA, heparin.       Review of Systems: As mentioned in the history of present illness. All other systems reviewed and are negative. Past Medical History:  Diagnosis Date   Allergy    Depression    Hypertension    Preeclampsia    Past Surgical History:  Procedure Laterality Date   CESAREAN SECTION     FOOT SURGERY     JOINT REPLACEMENT     TUBAL LIGATION     Social History:  reports that she has quit smoking. Her smoking use included cigarettes. She has never used smokeless tobacco. She reports current alcohol use. She reports that she does not use  drugs.  Allergies  Allergen Reactions   Morphine  Hives    itching    Family History  Problem Relation Age of Onset   Heart disease Mother 30       her first mini heart attack   COPD Mother    Diabetes Mellitus II Father    Diabetes Mellitus II Brother     Prior to Admission medications   Medication Sig Start Date End Date Taking? Authorizing Provider  acetaminophen  (TYLENOL ) 325 MG tablet Take 2 tablets (650 mg total) by mouth every 6 (six) hours as needed for mild pain (or Fever >/= 101). Patient not taking: Reported on 09/21/2017 05/20/17   Vachhani, Vaibhavkumar, MD  docusate sodium  (COLACE) 100 MG capsule Take 1 capsule (100 mg total) by mouth 2 (two) times daily. 05/20/17   Vachhani, Vaibhavkumar, MD  Multiple Vitamin (MULTI-VITAMINS) TABS Take by mouth.    [provider]  vitamin B-12 (CYANOCOBALAMIN) 500 MCG tablet Take 500 mcg by mouth daily.    [provider]    Physical Exam: Vitals:   10/15/23 1333 10/15/23 1336 10/15/23 1433 10/15/23 1530  BP:  (!) 129/99 133/65 129/78  Pulse:  82 87 67  Resp:  16 10 14   Temp:  98.5 F (36.9 C)  TempSrc:  Oral    SpO2:  95% 97% 100%  Weight: 63.5 kg     Height: 4' 9 (1.448 m)      General:  Appears calm and comfortable and is in NAD Eyes:  PERRL, EOMI, normal lids, iris ENT:  grossly normal hearing, lips & tongue, mmm Neck:  no LAD, masses or thyromegaly Cardiovascular:  RRR. No LE edema.  Respiratory:   CTA bilaterally with no wheezes/rales/rhonchi.  Normal respiratory effort. Abdomen:  soft, NT, ND Skin:  no rash or induration seen on limited exam Musculoskeletal:  grossly normal tone BUE/BLE, good ROM, no bony abnormality Psychiatric:  grossly normal mood and affect, speech fluent and appropriate, AOx3 Neurologic:  CN 2-12 grossly intact, moves all extremities in coordinated fashion   Radiological Exams on Admission: Independently reviewed - see discussion in A/P where applicable  DG Chest 2  View Result Date: 10/15/2023 CLINICAL DATA:  Intermittent chest pain starting Wednesday. EXAM: CHEST - 2 VIEW COMPARISON:  None Available. FINDINGS: Cardiac silhouette and mediastinal contours are within normal limits. The lungs are clear. No pleural effusion or pneumothorax. Moderate multilevel disc space narrowing, endplate sclerosis, and peripheral osteophytosis of the thoracic spine. Minimal dextrocurvature of the upper lumbar spine. IMPRESSION: No active cardiopulmonary disease. Electronically Signed   By: Bertina Broccoli M.D.   On: 10/15/2023 15:54    EKG: Independently reviewed.  NSR with rate 74; no evidence of acute ischemia x 2   Labs on Admission: I have personally reviewed the available labs and imaging studies at the time of the admission.  Pertinent labs:    Normal BMP HS troponin 96, 310 Normal CBC INR 1   Assessment and Plan: Principal Problem:   NSTEMI (non-ST elevated myocardial infarction) (HCC) Active Problems:   Essential hypertension   Depression   Class 1 obesity due to excess calories with body mass index (BMI) of 30.0 to 30.9 in adult    ACS/NSTEMI Patient with substernal chest pain that came on acutely with exertion/emotional stress and has been intermittent but increasing in frequency (decreasing in intensity) for several days 3/3 typical symptoms suggestive of typical angina CXR unremarkable.   Initial HS troponin elevated; repeat with positive delta.   EKG unremarkable x 2 Will admit since the patient has positive troponins with angina necessitating acute intervention. Risk factor stratification with HgbA1c and FLP; will also check TSH and UDS Cardiology consultation requested Patient appears likely to need invasive evaluation (cardiac catheterization) based on concerning history, elevated troponin  Started on heparin infusion NTG for symptom relief (although there is no mortality benefit) Needs beta blocker (PO, but only if not in HF or at risk for  shock) Start Lipitor 20 mg qhs for now, check lipids 9she reports low LDL based on vegetarian diet) She is on estrogen and this may need to be reconsidered; will hold for now Echo ordered  HTN Hold amlodipine as she will likely need beta blocker long-term Will start low-dose metoprolol Will also add prn hydralazine  Depression Continue bupropion  Class 1 obesity Body mass index is 30.3 kg/m.Aaron Aas  Weight loss should be encouraged on an ongoing basis On [facility] as an outpatient Outpatient PCP/bariatric medicine f/u encouraged Significantly low or high BMI is associated with higher medical risk including morbidity and mortality       Advance Care Planning:   Code Status: Full Code - Code status was discussed with the patient and/or family at the time of admission.  The patient would want to  receive full resuscitative measures at this time.   Consults: Cardiology  DVT Prophylaxis: Heparin infusion  Family Communication: None present; seh did not want me to call her daughter today  Severity of Illness: The appropriate patient status for this patient is INPATIENT. Inpatient status is judged to be reasonable and necessary in order to provide the required intensity of service to ensure the patient's safety. The patient's presenting symptoms, physical exam findings, and initial radiographic and laboratory data in the context of their chronic comorbidities is felt to place them at high risk for further clinical deterioration. Furthermore, it is not anticipated that the patient will be medically stable for discharge from the hospital within 2 midnights of admission.   * I certify that at the point of admission it is my clinical judgment that the patient will require inpatient hospital care spanning beyond 2 midnights from the point of admission due to high intensity of service, high risk for further deterioration and high frequency of surveillance required.*  Author: Lorita Corinthia,  MD 10/15/2023 5:09 PM  For on call review [email or web address].

## 2023-10-15 NOTE — ED Provider Notes (Addendum)
 [facility] Provider Note    Event Date/Time   First MD Initiated Contact with Patient 10/15/23 1504     (approximate)   History   Chest Pain   HPI  Angel Douglas is a 63 y.o. female past medical history significant for hypertension, depression, who presents to the emergency department with chest pain.  Chest pain started on Wednesday while she was at rest, she went to lay down and her chest pain resolved.  States that she had recurrent episode when trying to mow her lawn yesterday that lasted longer.  Today try to take her dog on a walk and had a recurrent episode of chest pain and jaw pain.  States that it has resolved now and is currently chest pain-free.  Denies nausea vomiting or diaphoresis.  No prior history of stress testing or cardiac catheterization.  Denies any tobacco use.     Physical Exam   Triage Vital Signs: ED Triage Vitals  Encounter Vitals Group     BP 10/15/23 1336 (!) 129/99     Girls Systolic BP Percentile --      Girls Diastolic BP Percentile --      Boys Systolic BP Percentile --      Boys Diastolic BP Percentile --      Pulse Rate 10/15/23 1336 82     Resp 10/15/23 1336 16     Temp 10/15/23 1336 98.5 F (36.9 C)     Temp Source 10/15/23 1336 Oral     SpO2 10/15/23 1336 95 %     Weight 10/15/23 1333 140 lb (63.5 kg)     Height 10/15/23 1333 4' 9 (1.448 m)     Head Circumference --      Peak Flow --      Pain Score 10/15/23 1333 1     Pain Loc --      Pain Education --      Exclude from Growth Chart --     Most recent vital signs: Vitals:   10/15/23 1433 10/15/23 1530  BP: 133/65 129/78  Pulse: 87 67  Resp: 10 14  Temp:    SpO2: 97% 100%    Physical Exam Constitutional:      Appearance: She is well-developed.  HENT:     Head: Atraumatic.   Eyes:     Conjunctiva/sclera: Conjunctivae normal.    Cardiovascular:     Rate and Rhythm: Regular rhythm.     Heart sounds: Normal heart sounds.  Pulmonary:      Effort: No respiratory distress.  Abdominal:     General: There is no distension.     Palpations: Abdomen is soft.   Musculoskeletal:        General: Normal range of motion.     Cervical back: Normal range of motion.     Right lower leg: No edema.     Left lower leg: No edema.   Skin:    General: Skin is warm.     Capillary Refill: Capillary refill takes less than 2 seconds.   Neurological:     Mental Status: She is alert. Mental status is at baseline.     IMPRESSION / MDM / ASSESSMENT AND PLAN / ED COURSE  I reviewed the triage vital signs and the nursing notes.  Differential diagnosis including ACS, anemia, pneumonia, gastritis/PUD, stress  EKG  I, Viviano Ground, the attending physician, personally viewed and interpreted this ECG.   Rate: Normal  Rhythm: Normal sinus  Axis: Normal  Intervals: Normal  ST&T Change: None No prior EKG to compare.  No tachycardic or bradycardic dysrhythmias while on cardiac telemetry.  RADIOLOGY I independently reviewed imaging, my interpretation of imaging: Chest x-ray no focal findings consistent with pneumonia.  No widened mediastinum.  No findings of heart failure.  LABS (all labs ordered are listed, but only abnormal results are displayed) Labs interpreted as -    Labs Reviewed  TROPONIN I (HIGH SENSITIVITY) - Abnormal; Notable for the following components:      Result Value   Troponin I (High Sensitivity) 96 (*)    All other components within normal limits  TROPONIN I (HIGH SENSITIVITY) - Abnormal; Notable for the following components:   Troponin I (High Sensitivity) 310 (*)    All other components within normal limits  BASIC METABOLIC PANEL WITH GFR  CBC  PROTIME-INR  APTT     MDM  Patient currently chest pain-free.  No significant leukocytosis or anemia.  Creatinine appears to be at her baseline.  Initial troponin is elevated at 96.  Chest x-ray with no focal findings consistent with pneumonia.  Clinical  picture is not consistent with pulmonary embolism, low risk Wells criteria, do not feel that further workup was necessary at this time.  No findings of heart failure.  Patient was given aspirin and started on heparin given concern for ACS.  Currently chest pain-free.  Moderate heart score.  Consulted hospitalist for admission.  Clinical Course as of 10/15/23 1633  Fri Oct 15, 2023  1632 Repeat troponin went to 310 from 95.  Repeat EKG with no findings of acute ischemia or dysrhythmia.  Remains chest pain-free.  Discussed with hospitalist Dr. Murrel Arnt who will discuss with cardiology. [SM]    Clinical Course User Index [SM] Viviano Ground, MD     PROCEDURES:  Critical Care performed: yes  .Critical Care  Performed by: Viviano Ground, MD Authorized by: Viviano Ground, MD   Critical care provider statement:    Critical care time (minutes):  30   Critical care time was exclusive of:  Separately billable procedures and treating other patients   Critical care was necessary to treat or prevent imminent or life-threatening deterioration of the following conditions:  Cardiac failure   Critical care was time spent personally by me on the following activities:  Development of treatment plan with patient or surrogate, discussions with consultants, evaluation of patient's response to treatment, examination of patient, ordering and review of laboratory studies, ordering and review of radiographic studies, ordering and performing treatments and interventions, pulse oximetry, re-evaluation of patient's condition and review of old charts   Patient's presentation is most consistent with acute presentation with potential threat to life or bodily function.   MEDICATIONS ORDERED IN ED: Medications  heparin ADULT infusion 100 units/mL (25000 units/250mL) (650 Units/hr Intravenous New Bag/Given 10/15/23 1617)  aspirin chewable tablet 324 mg (324 mg Oral Given 10/15/23 1611)  heparin bolus via infusion 3,200  Units (3,200 Units Intravenous Bolus from Bag 10/15/23 1617)    FINAL CLINICAL IMPRESSION(S) / ED DIAGNOSES   Final diagnoses:  Chest pain, unspecified type  NSTEMI (non-ST elevated myocardial infarction) (HCC)     Rx / DC Orders   ED Discharge Orders     None        Note:  This document was prepared using Dragon voice recognition software and may include unintentional dictation errors.   Viviano Ground, MD 10/15/23 1610    Viviano Ground, MD 10/15/23 [phone]

## 2023-10-16 ENCOUNTER — Inpatient Hospital Stay (HOSPITAL_COMMUNITY)
Admit: 2023-10-16 | Discharge: 2023-10-16 | Disposition: A | Discharge status: Home / Self Care | Attending: Internal Medicine | Admitting: Internal Medicine

## 2023-10-16 DIAGNOSIS — R079 Chest pain, unspecified: Secondary | ICD-10-CM

## 2023-10-16 LAB — CBC
HCT: 36.6 % (ref 36.0–46.0)
Hemoglobin: 12.3 g/dL (ref 12.0–15.0)
MCH: 31.1 pg (ref 26.0–34.0)
MCHC: 33.6 g/dL (ref 30.0–36.0)
MCV: 92.4 fL (ref 80.0–100.0)
Platelets: 217 10*3/uL (ref 150–400)
RBC: 3.96 MIL/uL (ref 3.87–5.11)
RDW: 12.5 % (ref 11.5–15.5)
WBC: 5.3 10*3/uL (ref 4.0–10.5)
nRBC: 0 % (ref 0.0–0.2)

## 2023-10-16 LAB — ECHOCARDIOGRAM COMPLETE
AR max vel: 1.6 cm2
AV Area VTI: 1.54 cm2
AV Area mean vel: 1.54 cm2
AV Mean grad: 6 mmHg
AV Peak grad: 12.3 mmHg
Ao pk vel: 1.75 m/s
Area-P 1/2: 3.93 cm2
Calc EF: 55.4 %
Height: 57 in
MV VTI: 1.68 cm2
S' Lateral: 3.1 cm
Single Plane A2C EF: 54.6 %
Single Plane A4C EF: 55 %
Weight: 2240 [oz_av]

## 2023-10-16 LAB — BASIC METABOLIC PANEL WITH GFR
Anion gap: 4 — ABNORMAL LOW (ref 5–15)
BUN: 12 mg/dL (ref 8–23)
CO2: 23 mmol/L (ref 22–32)
Calcium: 9.7 mg/dL (ref 8.9–10.3)
Chloride: 111 mmol/L (ref 98–111)
Creatinine, Ser: 0.54 mg/dL (ref 0.44–1.00)
GFR, Estimated: >60 mL/min (ref >60)
Glucose, Bld: 92 mg/dL (ref 70–99)
Potassium: 4.6 mmol/L (ref 3.5–5.1)
Sodium: 138 mmol/L (ref 135–145)

## 2023-10-16 LAB — LIPID PANEL
Cholesterol: 154 mg/dL (ref 0–200)
HDL: 54 mg/dL (ref >40)
LDL Cholesterol: 92 mg/dL (ref 0–99)
Total CHOL/HDL Ratio: 2.9 ratio
Triglycerides: 42 mg/dL (ref <150)
VLDL: 8 mg/dL (ref 0–40)

## 2023-10-16 LAB — HIV ANTIBODY (ROUTINE TESTING W REFLEX): HIV Screen 4th Generation wRfx: NONREACTIVE

## 2023-10-16 LAB — HEPARIN LEVEL (UNFRACTIONATED)
Heparin Unfractionated: 0.45 [IU]/mL (ref 0.30–0.70)
Heparin Unfractionated: 0.4 [IU]/mL (ref 0.30–0.70)
Heparin Unfractionated: 0.36 [IU]/mL (ref 0.30–0.70)

## 2023-10-16 LAB — TROPONIN I (HIGH SENSITIVITY): Troponin I (High Sensitivity): 93 ng/L — ABNORMAL HIGH (ref <18)

## 2023-10-16 MED ORDER — HEPARIN BOLUS VIA INFUSION
1600.0000 [IU] | Freq: Once | INTRAVENOUS | Status: AC
  Administered 2023-10-16: 1600 [IU] via INTRAVENOUS
  Filled 2023-10-16: qty 1600

## 2023-10-16 NOTE — Progress Notes (Signed)
 [facility] Progress Note  Patient: Angel Douglas    FMW:[id number]  DOA: 10/15/2023     Date of Service: the patient was seen and examined on 10/16/2023  Chief Complaint  Patient presents with   Chest Pain   Brief hospital course: Angel Douglas is a 63 y.o. female with medical history significant of HTN and depression who presented on 6/20 with chest pain.  Patient had off-and-on chest pain for few days mostly on exertion which resolved at rest.  Reviewed H&P details.  ED workup: VS stable Trop 96--310--345--93 Cardiology consulted PRS consulted for admission and further management as below  Assessment and Plan:  # NSTEMI Presented with exertional chest pain, improved at rest Troponin peaked at 345, trending down Currently patient is asymptomatic Continue to monitor on telemetry Continue aspirin  81 mg p.o. daily, Lipitor 20 mg p.o. daily and Lopressor  12.5 mg p.o. twice daily Use nitroglycerin  as needed Continue heparin  IV infusion Cardiology consulted, plan is for cardiac cath on Monday     Body mass index is 30.3 kg/m.  Interventions:   Diet: Heart healthy diet DVT Prophylaxis: Therapeutic Anticoagulation with heparin  IV infusion   Advance goals of care discussion: Full code  Family Communication: family was present at bedside, at the time of interview.  The pt provided permission to discuss medical plan with the family. Opportunity was given to ask question and all questions were answered satisfactorily.   Disposition:  Pt is from Home, admitted with NSTEMI, still on hep gtt and cardio planning for cardiac cath on Monday, which precludes a safe discharge. Discharge to home, when cleared by cardiology, plan is for cardiac cath on Monday.  Subjective: No significant events overnight, patient denies any chest pain or pressure, no shortness of breath.  Patient is able to walk to the bathroom without any discomfort.  Denied any active issues. Patient is  aware that cardiac cath will be done on Monday.  Physical Exam: General: NAD, lying comfortably Appear in no distress, affect appropriate Eyes: PERRLA ENT: Oral Mucosa Clear, moist  Neck: no JVD,  Cardiovascular: S1 and S2 Present, no Murmur,  Respiratory: good respiratory effort, Bilateral Air entry equal and Decreased, no Crackles, no wheezes Abdomen: Bowel Sound present, Soft and no tenderness,  Skin: no rashes Extremities: no Pedal edema, no calf tenderness Neurologic: without any new focal findings Gait not checked due to patient safety concerns  Vitals:   10/16/23 0421 10/16/23 0728 10/16/23 0933 10/16/23 1129  BP: 115/75 (!) 115/58 (!) 115/58 115/70  Pulse: 62 (!) 57 (!) 57 (!) 59  Resp: 18 17  16   Temp: 98 F (36.7 C) 97.8 F (36.6 C)  98.1 F (36.7 C)  TempSrc:  Oral  Oral  SpO2: 98% 100%  98%  Weight:      Height:        Intake/Output Summary (Last 24 hours) at 10/16/2023 1402 Last data filed at 10/16/2023 1100 Gross per 24 hour  Intake 361.97 ml  Output --  Net 361.97 ml   Filed Weights   10/15/23 1333  Weight: 63.5 kg    Data Reviewed: I have personally reviewed and interpreted daily labs, tele strips, imagings as discussed above. I reviewed all nursing notes, pharmacy notes, vitals, pertinent old records I have discussed plan of care as described above with RN and patient/family.  CBC: Recent Labs  Lab 10/15/23 1337 10/16/23 0459  WBC 6.8 5.3  HGB 13.4 12.3  HCT 37.9 36.6  MCV 91.8 92.4  PLT 232 217   Basic Metabolic Panel: Recent Labs  Lab 10/15/23 1337 10/16/23 0459  NA 140 138  K 4.2 4.6  CL 109 111  CO2 26 23  GLUCOSE 88 92  BUN 10 12  CREATININE 0.49 0.54  CALCIUM  9.7 9.7    Studies: No results found.  Scheduled Meds:  aspirin  EC  81 mg Oral Daily   atorvastatin   20 mg Oral q1800   metoprolol  tartrate  12.5 mg Oral BID   sodium chloride  flush  3 mL Intravenous Q12H   sodium chloride  flush  3 mL Intravenous Q12H    Continuous Infusions:  sodium chloride      heparin  850 Units/hr (10/16/23 0007)   PRN Meds: sodium chloride , acetaminophen , nitroGLYCERIN , ondansetron  (ZOFRAN ) IV, sodium chloride  flush  Time spent: 35 minutes  Author: ELVAN SOR. MD [facility] 10/16/2023 2:02 PM  To reach On-call, see care teams to locate the attending and reach out to them via [email or web address]. If 7PM-7AM, please contact night-coverage If you still have difficulty reaching the attending provider, please page the [facility] (Director on Call) for [facility] on amion for assistance.

## 2023-10-16 NOTE — Consult Note (Signed)
 PHARMACY - ANTICOAGULATION CONSULT NOTE  Pharmacy Consult for Heparin  Indication: chest pain/ACS  Allergies  Allergen Reactions   Morphine  Hives    itching    Patient Measurements: Height: 4' 9 (144.8 cm) Weight: 63.5 kg (140 lb) IBW/kg (Calculated) : 38.6 HEPARIN  DW (KG): 52.8  Vital Signs: Temp: 98.1 F (36.7 C) (06/21 1129) Temp Source: Oral (06/21 1129) BP: 115/70 (06/21 1129) Pulse Rate: 59 (06/21 1129)  Labs: Recent Labs    10/15/23 1337 10/15/23 1550 10/15/23 1936 10/15/23 2258 10/16/23 0459 10/16/23 1112  HGB 13.4  --   --   --  12.3  --   HCT 37.9  --   --   --  36.6  --   PLT 232  --   --   --  217  --   APTT 27  --   --   --   --   --   LABPROT 13.1  --   --   --   --   --   INR 1.0  --   --   --   --   --   HEPARINUNFRC  --   --   --  0.17* 0.45 0.40  CREATININE 0.49  --   --   --  0.54  --   TROPONINIHS 96* 310* 345*  --   --  93*    Estimated Creatinine Clearance: 55.9 mL/min (by C-G formula based on SCr of 0.54 mg/dL).   Medical History: Past Medical History:  Diagnosis Date   Allergy    Depression    Hypertension    Preeclampsia     Medications:  No history of chronic anticoagulant use PTA  Assessment: 63 y.o. female past medical history significant for hypertension, depression, who presents to the emergency department with chest pain.  Chest pain started on Wednesday while she was at rest, she went to lay down and her chest pain resolved. Troponin levels 96-->310, Pharmacy has been consulted to initiate and dose continuous heparin  infusion.  Baseline labs: aPTT 27 sec, INR 1.0, Plts 232, Hgb 13.4  Goal of Therapy:  Heparin  level 0.3-0.7 units/ml Monitor platelets by anticoagulation protocol: Yes  6/20:  HL @ 2258 = 0.17, SUBtherapeutic   Plan:  6/20: HL 0.40, therapeutic x 1 - Continue heparin  drip rate at 850 units/hr - Check confirmatory HL in 6 hrs - CBC daily   Dallie Patton A Tekoa Amon 10/16/2023,12:14 PM

## 2023-10-16 NOTE — Plan of Care (Signed)

## 2023-10-16 NOTE — Plan of Care (Signed)

## 2023-10-16 NOTE — Consult Note (Signed)
 PHARMACY - ANTICOAGULATION CONSULT NOTE  Pharmacy Consult for Heparin  Indication: chest pain/ACS  Allergies  Allergen Reactions   Morphine  Hives    itching    Patient Measurements: Height: 4' 9 (144.8 cm) Weight: 63.5 kg (140 lb) IBW/kg (Calculated) : 38.6 HEPARIN  DW (KG): 52.8  Vital Signs: Temp: 98.1 F (36.7 C) (06/20 2259) Temp Source: Oral (06/20 2259) BP: 116/74 (06/20 2259) Pulse Rate: 68 (06/20 2259)  Labs: Recent Labs    10/15/23 1337 10/15/23 1550 10/15/23 1936 10/15/23 2258  HGB 13.4  --   --   --   HCT 37.9  --   --   --   PLT 232  --   --   --   APTT 27  --   --   --   LABPROT 13.1  --   --   --   INR 1.0  --   --   --   HEPARINUNFRC  --   --   --  0.17*  CREATININE 0.49  --   --   --   TROPONINIHS 96* 310* 345*  --     Estimated Creatinine Clearance: 55.9 mL/min (by C-G formula based on SCr of 0.49 mg/dL).   Medical History: Past Medical History:  Diagnosis Date   Allergy    Depression    Hypertension    Preeclampsia     Medications:  No history of chronic anticoagulant use PTA  Assessment: 62 y.o. female past medical history significant for hypertension, depression, who presents to the emergency department with chest pain.  Chest pain started on Wednesday while she was at rest, she went to lay down and her chest pain resolved. Troponin levels 96-->310, Pharmacy has been consulted to initiate and dose continuous heparin  infusion.  Baseline labs: aPTT 27 sec, INR 1.0, Plts 232, Hgb 13.4  Goal of Therapy:  Heparin  level 0.3-0.7 units/ml Monitor platelets by anticoagulation protocol: Yes   Plan:  6/20:  HL @ 2258 = 0.17, SUBtherapeutic - Will order heparin  1600 units IV X 1 bolus and increase drip rate to 850 units/hr - Will recheck HL 6 hrs after rate change - CBC daily   Magic Mohler D 10/16/2023,12:05 AM

## 2023-10-16 NOTE — Progress Notes (Signed)
   10/16/23 0915  Spiritual Encounters  Type of Visit Initial  Care provided to: Pt and family  Referral source Nurse (RN/NT/LPN)  Reason for visit Advance directives  OnCall Visit No  Interventions  Spiritual Care Interventions Made Established relationship of care and support;Compassionate presence  Intervention Outcomes  Outcomes Connection to spiritual care;Awareness around self/spiritual resourses   Chaplain responded to spiritual consult for advance directives. Pt was visiting with new granddaughter and chaplain told her to let the nurse know when she is ready to sign advance directives.

## 2023-10-16 NOTE — Consult Note (Signed)
 PHARMACY - ANTICOAGULATION CONSULT NOTE  Pharmacy Consult for Heparin  Indication: chest pain/ACS  Allergies  Allergen Reactions   Morphine  Hives    itching    Patient Measurements: Height: 4' 9 (144.8 cm) Weight: 63.5 kg (140 lb) IBW/kg (Calculated) : 38.6 HEPARIN  DW (KG): 52.8  Vital Signs: Temp: 98.2 F (36.8 C) (06/21 1606) Temp Source: Oral (06/21 1606) BP: 123/56 (06/21 1606) Pulse Rate: 62 (06/21 1606)  Labs: Recent Labs    10/15/23 1337 10/15/23 1550 10/15/23 1936 10/15/23 2258 10/16/23 0459 10/16/23 1112 10/16/23 1653  HGB 13.4  --   --   --  12.3  --   --   HCT 37.9  --   --   --  36.6  --   --   PLT 232  --   --   --  217  --   --   APTT 27  --   --   --   --   --   --   LABPROT 13.1  --   --   --   --   --   --   INR 1.0  --   --   --   --   --   --   HEPARINUNFRC  --   --   --    < > 0.45 0.40 0.36  CREATININE 0.49  --   --   --  0.54  --   --   TROPONINIHS 96* 310* 345*  --   --  93*  --    < > = values in this interval not displayed.    Estimated Creatinine Clearance: 55.9 mL/min (by C-G formula based on SCr of 0.54 mg/dL).   Medical History: Past Medical History:  Diagnosis Date   Allergy    Depression    Hypertension    Preeclampsia     Medications:  No history of chronic anticoagulant use PTA  Assessment: 63 y.o. female past medical history significant for hypertension, depression, who presents to the emergency department with chest pain.  Chest pain started on Wednesday while she was at rest, she went to lay down and her chest pain resolved. Troponin levels 96-->310, Pharmacy has been consulted to initiate and dose continuous heparin  infusion.  Baseline labs: aPTT 27 sec, INR 1.0, Plts 232, Hgb 13.4  Goal of Therapy:  Heparin  level 0.3-0.7 units/ml Monitor platelets by anticoagulation protocol: Yes  6/20:  HL @ 2258 = 0.17, SUBtherapeutic 6/20:  HL @ 1112 = 0.40, therapeutic x 1 6/20:  HL @ 1653 = 0.36, therapeutic x 2    Plan:  Heparin  level therapeutic x 2 Continue heparin  infusion at 850 units/hr Check HL daily while therapeutic CBC daily while on heparin   Kayla JULIANNA Blew, PharmD, BCPS 10/16/2023,5:25 PM

## 2023-10-16 NOTE — Progress Notes (Signed)
*  PRELIMINARY RESULTS* Echocardiogram 2D Echocardiogram has been performed.  Angel Douglas Angel Douglas 10/16/2023, 3:42 PM

## 2023-10-16 NOTE — Progress Notes (Signed)
 Patient ID: Angel Douglas, female   DOB: 1960/07/23, 63 y.o.   MRN: [id number] [facility]    SUBJECTIVE: States to be doing reasonably well no further chest pain no shortness of breath resting comfortably in bed   Vitals:   10/15/23 2259 10/16/23 0421 10/16/23 0728 10/16/23 0933  BP: 116/74 115/75 (!) 115/58 (!) 115/58  Pulse: 68 62 (!) 57 (!) 57  Resp: 20 18 17    Temp: 98.1 F (36.7 C) 98 F (36.7 C) 97.8 F (36.6 C)   TempSrc: Oral  Oral   SpO2: 100% 98% 100%   Weight:      Height:         Intake/Output Summary (Last 24 hours) at 10/16/2023 9047 Last data filed at 10/16/2023 0300 Gross per 24 hour  Intake 121.97 ml  Output --  Net 121.97 ml      PHYSICAL EXAM  General: Well developed, well nourished, in no acute distress HEENT:  Normocephalic and atramatic Neck:  No JVD.  Lungs: Clear bilaterally to auscultation and percussion. Heart: HRRR . Normal S1 and S2 without gallops or murmurs.  Abdomen: Bowel sounds are positive, abdomen soft and non-tender  Msk:  Back normal, normal gait. Normal strength and tone for age. Extremities: No clubbing, cyanosis or edema.   Neuro: Alert and oriented X 3. Psych:  Good affect, responds appropriately   LABS: Basic Metabolic Panel: Recent Labs    10/15/23 1337 10/16/23 0459  NA 140 138  K 4.2 4.6  CL 109 111  CO2 26 23  GLUCOSE 88 92  BUN 10 12  CREATININE 0.49 0.54  CALCIUM  9.7 9.7   Liver Function Tests: No results for input(s): AST, ALT, ALKPHOS, BILITOT, PROT, ALBUMIN in the last 72 hours. No results for input(s): LIPASE, AMYLASE in the last 72 hours. CBC: Recent Labs    10/15/23 1337 10/16/23 0459  WBC 6.8 5.3  HGB 13.4 12.3  HCT 37.9 36.6  MCV 91.8 92.4  PLT 232 217   Cardiac Enzymes: No results for input(s): CKTOTAL, CKMB, CKMBINDEX, TROPONINI in the last 72 hours. BNP: Invalid input(s): POCBNP D-Dimer: No results for input(s): DDIMER in the last 72  hours. Hemoglobin A1C: No results for input(s): HGBA1C in the last 72 hours. Fasting Lipid Panel: Recent Labs    10/16/23 0459  CHOL 154  HDL 54  LDLCALC 92  TRIG 42  CHOLHDL 2.9   Thyroid Function Tests: No results for input(s): TSH, T4TOTAL, T3FREE, THYROIDAB in the last 72 hours.  Invalid input(s): FREET3 Anemia Panel: No results for input(s): VITAMINB12, FOLATE, FERRITIN, TIBC, IRON, RETICCTPCT in the last 72 hours.  DG Chest 2 View Result Date: 10/15/2023 CLINICAL DATA:  Intermittent chest pain starting Wednesday. EXAM: CHEST - 2 VIEW COMPARISON:  None Available. FINDINGS: Cardiac silhouette and mediastinal contours are within normal limits. The lungs are clear. No pleural effusion or pneumothorax. Moderate multilevel disc space narrowing, endplate sclerosis, and peripheral osteophytosis of the thoracic spine. Minimal dextrocurvature of the upper lumbar spine. IMPRESSION: No active cardiopulmonary disease. Electronically Signed   By: Tanda Lyons M.D.   On: 10/15/2023 15:54     Echo pending  TELEMETRY: Normal sinus rhythm rate of 80 nonspecific T2 changes:  ASSESSMENT AND PLAN:  Principal Problem:   NSTEMI (non-ST elevated myocardial infarction) (HCC) Active Problems:   Essential hypertension   Depression   Class 1 obesity due to excess calories with body mass index (BMI) of 30.0 to 30.9 in adult  Plan Agree with continued telemetry follow-up troponins EKGs Maintain anticoagulation with heparin  aspirin  Echocardiogram for left ventricular function wall motion and valvular structures- Continue hypertension management and control we will transition from amlodipine to metoprolol  on an ACE or ARB Recommend cardiac cath prior to discharge   Cara JONETTA Lovelace, MD 10/16/2023 9:52 AM

## 2023-10-17 LAB — CBC
HCT: 36.7 % (ref 36.0–46.0)
Hemoglobin: 12.4 g/dL (ref 12.0–15.0)
MCH: 30.9 pg (ref 26.0–34.0)
MCHC: 33.8 g/dL (ref 30.0–36.0)
MCV: 91.5 fL (ref 80.0–100.0)
Platelets: 206 10*3/uL (ref 150–400)
RBC: 4.01 MIL/uL (ref 3.87–5.11)
RDW: 12.2 % (ref 11.5–15.5)
WBC: 6.5 10*3/uL (ref 4.0–10.5)
nRBC: 0 % (ref 0.0–0.2)

## 2023-10-17 LAB — BASIC METABOLIC PANEL WITH GFR
Anion gap: 6 (ref 5–15)
BUN: 15 mg/dL (ref 8–23)
CO2: 24 mmol/L (ref 22–32)
Calcium: 9.6 mg/dL (ref 8.9–10.3)
Chloride: 109 mmol/L (ref 98–111)
Creatinine, Ser: 0.55 mg/dL (ref 0.44–1.00)
GFR, Estimated: >60 mL/min (ref >60)
Glucose, Bld: 94 mg/dL (ref 70–99)
Potassium: 3.8 mmol/L (ref 3.5–5.1)
Sodium: 139 mmol/L (ref 135–145)

## 2023-10-17 LAB — MAGNESIUM: Magnesium: 2.2 mg/dL (ref 1.7–2.4)

## 2023-10-17 LAB — HEPARIN LEVEL (UNFRACTIONATED): Heparin Unfractionated: 0.33 [IU]/mL (ref 0.30–0.70)

## 2023-10-17 LAB — PHOSPHORUS: Phosphorus: 4.1 mg/dL (ref 2.5–4.6)

## 2023-10-17 MED ORDER — SODIUM CHLORIDE 0.9 % WEIGHT BASED INFUSION: 1.0000 mL/kg/h | INTRAVENOUS | Status: DC

## 2023-10-17 MED ORDER — ASPIRIN 81 MG PO CHEW
81.0000 mg | CHEWABLE_TABLET | ORAL | Status: AC
  Administered 2023-10-18: 81 mg via ORAL
  Filled 2023-10-17: qty 1

## 2023-10-17 MED ORDER — SODIUM CHLORIDE 0.9 % WEIGHT BASED INFUSION
3.0000 mL/kg/h | INTRAVENOUS | Status: AC
Start: 2023-10-18 — End: 2023-10-18
  Administered 2023-10-18: 3 mL/kg/h via INTRAVENOUS

## 2023-10-17 NOTE — Consult Note (Signed)
 PHARMACY - ANTICOAGULATION CONSULT NOTE  Pharmacy Consult for Heparin  Indication: chest pain/ACS  Allergies  Allergen Reactions   Morphine  Hives    itching    Patient Measurements: Height: 4' 9 (144.8 cm) Weight: 63.5 kg (140 lb) IBW/kg (Calculated) : 38.6 HEPARIN  DW (KG): 52.8  Vital Signs: Temp: 97.7 F (36.5 C) (06/22 0334) BP: 122/74 (06/22 0334) Pulse Rate: 48 (06/22 0334)  Labs: Recent Labs    10/15/23 1337 10/15/23 1550 10/15/23 1936 10/15/23 2258 10/16/23 0459 10/16/23 1112 10/16/23 1653 10/17/23 0508  HGB 13.4  --   --   --  12.3  --   --  12.4  HCT 37.9  --   --   --  36.6  --   --  36.7  PLT 232  --   --   --  217  --   --  206  APTT 27  --   --   --   --   --   --   --   LABPROT 13.1  --   --   --   --   --   --   --   INR 1.0  --   --   --   --   --   --   --   HEPARINUNFRC  --   --   --    < > 0.45 0.40 0.36 0.33  CREATININE 0.49  --   --   --  0.54  --   --   --   TROPONINIHS 96* 310* 345*  --   --  93*  --   --    < > = values in this interval not displayed.    Estimated Creatinine Clearance: 55.9 mL/min (by C-G formula based on SCr of 0.54 mg/dL).   Medical History: Past Medical History:  Diagnosis Date   Allergy    Depression    Hypertension    Preeclampsia     Medications:  No history of chronic anticoagulant use PTA  Assessment: 63 y.o. female past medical history significant for hypertension, depression, who presents to the emergency department with chest pain.  Chest pain started on Wednesday while she was at rest, she went to lay down and her chest pain resolved. Troponin levels 96-->310, Pharmacy has been consulted to initiate and dose continuous heparin  infusion.  Baseline labs: aPTT 27 sec, INR 1.0, Plts 232, Hgb 13.4  Goal of Therapy:  Heparin  level 0.3-0.7 units/ml Monitor platelets by anticoagulation protocol: Yes  6/20:  HL @ 2258 = 0.17, SUBtherapeutic 6/21:  HL @ 0459 = 0.45, therapeutic X 1 6/21:  HL @ 1112 =  0.40, therapeutic  X 2 6/21:  HL @ 1653 = 0.36, therapeutic X 3 6/22:  HL @ 0508 = 0.33, therapeutic X 4    Plan:  Heparin  level therapeutic x 4 Continue heparin  infusion at 850 units/hr Check HL daily while therapeutic CBC daily while on heparin   Rufino Staup D, PharmD 10/17/2023,6:14 AM

## 2023-10-17 NOTE — Plan of Care (Signed)
  Problem: Education: Goal: Understanding of cardiac disease, CV risk reduction, and recovery process will improve Outcome: Progressing   Problem: Activity: Goal: Ability to tolerate increased activity will improve Outcome: Progressing   Problem: Cardiac: Goal: Ability to achieve and maintain adequate cardiovascular perfusion will improve Outcome: Progressing   Problem: Education: Goal: Knowledge of General Education information will improve Description: Including pain rating scale, medication(s)/side effects and non-pharmacologic comfort measures Outcome: Progressing   

## 2023-10-17 NOTE — Progress Notes (Signed)
 [facility] Progress Note  Patient: Angel Douglas    FMW:[id number]  DOA: 10/15/2023     Date of Service: the patient was seen and examined on 10/17/2023  Chief Complaint  Patient presents with   Chest Pain   Brief hospital course: Angel Douglas is a 63 y.o. female with medical history significant of HTN and depression who presented on 6/20 with chest pain.  Patient had off-and-on chest pain for few days mostly on exertion which resolved at rest.  Reviewed H&P details.  ED workup: VS stable Trop [phone]--[phone]--[phone]--[phone] Cardiology consulted PRS consulted for admission and further management as below  Assessment and Plan:  # NSTEMI Presented with exertional chest pain, improved at rest Troponin peaked at 345, trending down Currently patient is asymptomatic Continue to monitor on telemetry Continue aspirin  81 mg p.o. daily, Lipitor 20 mg p.o. daily and Lopressor  12.5 mg p.o. twice daily Use nitroglycerin  as needed Continue heparin  IV infusion TTE LVEF 6065%, no LV WMA, no any significant valvular abnormality. Cardiology consulted, plan is for cardiac cath on Monday Keep n.p.o. after midnight    Body mass index is 30.3 kg/m.  Interventions:   Diet: Heart healthy diet DVT Prophylaxis: Therapeutic Anticoagulation with heparin  IV infusion   Advance goals of care discussion: Full code  Family Communication: family was present at bedside, at the time of interview.  The pt provided permission to discuss medical plan with the family. Opportunity was given to ask question and all questions were answered satisfactorily.   Disposition:  Pt is from Home, admitted with NSTEMI, still on hep gtt and cardio planning for cardiac cath on Monday, which precludes a safe discharge. Discharge to home, when cleared by cardiology, plan is for cardiac cath on Monday.  Subjective: No significant events overnight, patient was sitting out of bed in the recliner, denied any complaints, no  chest pain or palpitation. The patient is ready for cardiac cath which will be done tomorrow, needs to be n.p.o. after midnight  Physical Exam: General: NAD, lying comfortably Appear in no distress, affect appropriate Eyes: PERRLA ENT: Oral Mucosa Clear, moist  Neck: no JVD,  Cardiovascular: S1 and S2 Present, no Murmur,  Respiratory: good respiratory effort, Bilateral Air entry equal and Decreased, no Crackles, no wheezes Abdomen: Bowel Sound present, Soft and no tenderness,  Skin: no rashes Extremities: no Pedal edema, no calf tenderness Neurologic: without any new focal findings Gait not checked due to patient safety concerns  Vitals:   10/17/23 0015 10/17/23 0334 10/17/23 0847 10/17/23 1238  BP: (!) 99/55 122/74 130/74 115/69  Pulse: 63 (!) 48 92 83  Resp: 18 18  16   Temp: 98 F (36.7 C) 97.7 F (36.5 C) (!) 97.5 F (36.4 C) 98.3 F (36.8 C)  TempSrc:   Oral Oral  SpO2: 98% 100% 100% 98%  Weight:      Height:        Intake/Output Summary (Last 24 hours) at 10/17/2023 1436 Last data filed at 10/16/2023 1500 Gross per 24 hour  Intake 0 ml  Output --  Net 0 ml   Filed Weights   10/15/23 1333  Weight: 63.5 kg    Data Reviewed: I have personally reviewed and interpreted daily labs, tele strips, imagings as discussed above. I reviewed all nursing notes, pharmacy notes, vitals, pertinent old records I have discussed plan of care as described above with RN and patient/family.  CBC: Recent Labs  Lab 10/15/23 1337 10/16/23 0459 10/17/23 0508  WBC 6.8  5.3 6.5  HGB 13.4 12.3 12.4  HCT 37.9 36.6 36.7  MCV 91.8 92.4 91.5  PLT 232 217 206   Basic Metabolic Panel: Recent Labs  Lab 10/15/23 1337 10/16/23 0459 10/17/23 0508  NA 140 138 139  K 4.2 4.6 3.8  CL 109 111 109  CO2 26 23 24   GLUCOSE 88 92 94  BUN 10 12 15   CREATININE 0.49 0.54 0.55  CALCIUM  9.7 9.7 9.6  MG  --   --  2.2  PHOS  --   --  4.1    Studies: ECHOCARDIOGRAM COMPLETE Result Date:  10/16/2023    ECHOCARDIOGRAM REPORT   Patient Name:   Angel Douglas Date of Exam: 10/16/2023 Medical Rec #:  [id number]          Height:       57.0 in Accession #:    [id number]         Weight:       140.0 lb Date of Birth:  12/15/1960          BSA:          1.546 m Patient Age:    62 years           BP:           115/58 mmHg Patient Gender: F                  HR:           59 bpm. Exam Location:  [facility] Procedure: 2D Echo, Cardiac Doppler and Color Doppler (Both Spectral and Color            Flow Doppler were utilized during procedure). Indications:     Chest Pain R07.9  History:         Patient has no prior history of Echocardiogram examinations.                  Risk Factors:Hypertension.  Sonographer:     Bari Roar Referring Phys:  [id number] DELON HERALD Diagnosing Phys: Timothy Gollan MD IMPRESSIONS  1. Left ventricular ejection fraction, by estimation, is 60 to 65%. The left ventricle has normal function. The left ventricle has no regional wall motion abnormalities. Left ventricular diastolic parameters were normal.  2. Right ventricular systolic function is normal. The right ventricular size is normal. There is normal pulmonary artery systolic pressure. The estimated right ventricular systolic pressure is 23.8 mmHg.  3. The mitral valve is normal in structure. Moderate mitral valve regurgitation. No evidence of mitral stenosis.  4. The aortic valve is normal in structure. Aortic valve regurgitation is not visualized. No aortic stenosis is present.  5. The inferior vena cava is normal in size with greater than 50% respiratory variability, suggesting right atrial pressure of 3 mmHg. FINDINGS  Left Ventricle: Left ventricular ejection fraction, by estimation, is 60 to 65%. The left ventricle has normal function. The left ventricle has no regional wall motion abnormalities. Strain was performed and the global longitudinal strain is indeterminate. The left ventricular internal cavity size was normal in size.  There is no left ventricular hypertrophy. Left ventricular diastolic parameters were normal. Right Ventricle: The right ventricular size is normal. No increase in right ventricular wall thickness. Right ventricular systolic function is normal. There is normal pulmonary artery systolic pressure. The tricuspid regurgitant velocity is 2.17 m/s, and  with an assumed right atrial pressure of 5 mmHg, the estimated right ventricular systolic pressure is 23.8 mmHg. Left Atrium: Left  atrial size was normal in size. Right Atrium: Right atrial size was normal in size. Pericardium: There is no evidence of pericardial effusion. Mitral Valve: The mitral valve is normal in structure. There is mild calcification of the mitral valve leaflet(s). Moderate mitral valve regurgitation. No evidence of mitral valve stenosis. MV peak gradient, 5.0 mmHg. The mean mitral valve gradient is 2.0 mmHg. Tricuspid Valve: The tricuspid valve is normal in structure. Tricuspid valve regurgitation is mild . No evidence of tricuspid stenosis. Aortic Valve: The aortic valve is normal in structure. Aortic valve regurgitation is not visualized. No aortic stenosis is present. Aortic valve mean gradient measures 6.0 mmHg. Aortic valve peak gradient measures 12.2 mmHg. Aortic valve area, by VTI measures 1.54 cm. Pulmonic Valve: The pulmonic valve was normal in structure. Pulmonic valve regurgitation is not visualized. No evidence of pulmonic stenosis. Aorta: The aortic root is normal in size and structure. Venous: The inferior vena cava is normal in size with greater than 50% respiratory variability, suggesting right atrial pressure of 3 mmHg. IAS/Shunts: No atrial level shunt detected by color flow Doppler. Additional Comments: 3D was performed not requiring image post processing on an independent workstation and was indeterminate.  LEFT VENTRICLE PLAX 2D LVIDd:         4.20 cm     Diastology LVIDs:         3.10 cm     LV e' medial:    12.10 cm/s LV PW:          0.90 cm     LV E/e' medial:  8.6 LV IVS:        1.00 cm     LV e' lateral:   14.00 cm/s LVOT diam:     1.70 cm     LV E/e' lateral: 7.4 LV SV:         53 LV SV Index:   34 LVOT Area:     2.27 cm  LV Volumes (MOD) LV vol d, MOD A2C: 66.7 ml LV vol d, MOD A4C: 77.5 ml LV vol s, MOD A2C: 30.3 ml LV vol s, MOD A4C: 34.9 ml LV SV MOD A2C:     36.4 ml LV SV MOD A4C:     77.5 ml LV SV MOD BP:      40.2 ml RIGHT VENTRICLE RV Basal diam:  2.80 cm RV Mid diam:    2.70 cm RV S prime:     13.20 cm/s TAPSE (M-mode): 2.1 cm LEFT ATRIUM             Index        RIGHT ATRIUM           Index LA diam:        3.60 cm 2.33 cm/m   RA Area:     12.60 cm LA Vol (A2C):   54.4 ml 35.20 ml/m  RA Volume:   24.90 ml  16.11 ml/m LA Vol (A4C):   46.3 ml 29.95 ml/m LA Biplane Vol: 52.0 ml 33.64 ml/m  AORTIC VALVE                     PULMONIC VALVE AV Area (Vmax):    1.60 cm      PV Vmax:        1.01 m/s AV Area (Vmean):   1.54 cm      PV Peak grad:   4.1 mmHg AV Area (VTI):     1.54 cm  RVOT Peak grad: 2 mmHg AV Vmax:           175.00 cm/s AV Vmean:          111.000 cm/s AV VTI:            0.343 m AV Peak Grad:      12.2 mmHg AV Mean Grad:      6.0 mmHg LVOT Vmax:         123.00 cm/s LVOT Vmean:        75.500 cm/s LVOT VTI:          0.233 m LVOT/AV VTI ratio: 0.68  AORTA Ao Root diam: 2.80 cm Ao Asc diam:  3.10 cm MITRAL VALVE                TRICUSPID VALVE MV Area (PHT): 3.93 cm     TR Peak grad:   18.8 mmHg MV Area VTI:   1.68 cm     TR Vmax:        217.00 cm/s MV Peak grad:  5.0 mmHg MV Mean grad:  2.0 mmHg     SHUNTS MV Vmax:       1.12 m/s     Systemic VTI:  0.23 m MV Vmean:      67.4 cm/s    Systemic Diam: 1.70 cm MV Decel Time: 193 msec MV E velocity: 104.00 cm/s MV A velocity: 88.00 cm/s MV E/A ratio:  1.18 MV A Prime:    11.9 cm/s Evalene Lunger MD Electronically signed by Evalene Lunger MD Signature Date/Time: 10/16/2023/3:53:45 PM    Final     Scheduled Meds:  aspirin  EC  81 mg Oral Daily   atorvastatin   20 mg  Oral q1800   metoprolol  tartrate  12.5 mg Oral BID   sodium chloride  flush  3 mL Intravenous Q12H   sodium chloride  flush  3 mL Intravenous Q12H   Continuous Infusions:  heparin  850 Units/hr (10/16/23 1922)   PRN Meds: acetaminophen , nitroGLYCERIN , ondansetron  (ZOFRAN ) IV, sodium chloride  flush  Time spent: 35 minutes  Author: ELVAN SOR. MD [facility] 10/17/2023 2:36 PM  To reach On-call, see care teams to locate the attending and reach out to them via [email or web address]. If 7PM-7AM, please contact night-coverage If you still have difficulty reaching the attending provider, please page the [facility] (Director on Call) for [facility] on amion for assistance.

## 2023-10-17 NOTE — Progress Notes (Addendum)
 Patient ID: Kaena Santori, female   DOB: Nov 05, 1960, 63 y.o.   MRN: [id number] [facility] Cardiology    SUBJECTIVE: Resting comfortably no chest pain since admission on heparin  plan for cardiac cath in the morning   Vitals:   10/16/23 2033 10/17/23 0015 10/17/23 0334 10/17/23 0847  BP: 110/77 (!) 99/55 122/74 130/74  Pulse: 65 63 (!) 48 92  Resp: 18 18 18    Temp: (!) 97.4 F (36.3 C) 98 F (36.7 C) 97.7 F (36.5 C) (!) 97.5 F (36.4 C)  TempSrc:    Oral  SpO2: 98% 98% 100% 100%  Weight:      Height:         Intake/Output Summary (Last 24 hours) at 10/17/2023 9043 Last data filed at 10/16/2023 1500 Gross per 24 hour  Intake 240 ml  Output --  Net 240 ml      PHYSICAL EXAM  General: Well developed, well nourished, in no acute distress HEENT:  Normocephalic and atramatic Neck:  No JVD.  Lungs: Clear bilaterally to auscultation and percussion. Heart: HRRR . Normal S1 and S2 without gallops or murmurs.  Abdomen: Bowel sounds are positive, abdomen soft and non-tender  Msk:  Back normal, normal gait. Normal strength and tone for age. Extremities: No clubbing, cyanosis or edema.   Neuro: Alert and oriented X 3. Psych:  Good affect, responds appropriately   LABS: Basic Metabolic Panel: Recent Labs    10/16/23 0459 10/17/23 0508  NA 138 139  K 4.6 3.8  CL 111 109  CO2 23 24  GLUCOSE 92 94  BUN 12 15  CREATININE 0.54 0.55  CALCIUM  9.7 9.6  MG  --  2.2  PHOS  --  4.1   Liver Function Tests: No results for input(s): AST, ALT, ALKPHOS, BILITOT, PROT, ALBUMIN in the last 72 hours. No results for input(s): LIPASE, AMYLASE in the last 72 hours. CBC: Recent Labs    10/16/23 0459 10/17/23 0508  WBC 5.3 6.5  HGB 12.3 12.4  HCT 36.6 36.7  MCV 92.4 91.5  PLT 217 206   Cardiac Enzymes: No results for input(s): CKTOTAL, CKMB, CKMBINDEX, TROPONINI in the last 72 hours. BNP: Invalid input(s): POCBNP D-Dimer: No results for input(s):  DDIMER in the last 72 hours. Hemoglobin A1C: No results for input(s): HGBA1C in the last 72 hours. Fasting Lipid Panel: Recent Labs    10/16/23 0459  CHOL 154  HDL 54  LDLCALC 92  TRIG 42  CHOLHDL 2.9   Thyroid Function Tests: No results for input(s): TSH, T4TOTAL, T3FREE, THYROIDAB in the last 72 hours.  Invalid input(s): FREET3 Anemia Panel: No results for input(s): VITAMINB12, FOLATE, FERRITIN, TIBC, IRON, RETICCTPCT in the last 72 hours.  ECHOCARDIOGRAM COMPLETE Result Date: 10/16/2023    ECHOCARDIOGRAM REPORT   Patient Name:   CARIN SHIPP Date of Exam: 10/16/2023 Medical Rec #:  [id number]          Height:       57.0 in Accession #:    [id number]         Weight:       140.0 lb Date of Birth:  Sep 23, 1960          BSA:          1.546 m Patient Age:    62 years           BP:           115/58 mmHg Patient Gender: F  HR:           59 bpm. Exam Location:  [facility] Procedure: 2D Echo, Cardiac Doppler and Color Doppler (Both Spectral and Color            Flow Doppler were utilized during procedure). Indications:     Chest Pain R07.9  History:         Patient has no prior history of Echocardiogram examinations.                  Risk Factors:Hypertension.  Sonographer:     Bari Roar Referring Phys:  [id number] DELON HERALD Diagnosing Phys: Timothy Gollan MD IMPRESSIONS  1. Left ventricular ejection fraction, by estimation, is 60 to 65%. The left ventricle has normal function. The left ventricle has no regional wall motion abnormalities. Left ventricular diastolic parameters were normal.  2. Right ventricular systolic function is normal. The right ventricular size is normal. There is normal pulmonary artery systolic pressure. The estimated right ventricular systolic pressure is 23.8 mmHg.  3. The mitral valve is normal in structure. Moderate mitral valve regurgitation. No evidence of mitral stenosis.  4. The aortic valve is normal in structure. Aortic valve  regurgitation is not visualized. No aortic stenosis is present.  5. The inferior vena cava is normal in size with greater than 50% respiratory variability, suggesting right atrial pressure of 3 mmHg. FINDINGS  Left Ventricle: Left ventricular ejection fraction, by estimation, is 60 to 65%. The left ventricle has normal function. The left ventricle has no regional wall motion abnormalities. Strain was performed and the global longitudinal strain is indeterminate. The left ventricular internal cavity size was normal in size. There is no left ventricular hypertrophy. Left ventricular diastolic parameters were normal. Right Ventricle: The right ventricular size is normal. No increase in right ventricular wall thickness. Right ventricular systolic function is normal. There is normal pulmonary artery systolic pressure. The tricuspid regurgitant velocity is 2.17 m/s, and  with an assumed right atrial pressure of 5 mmHg, the estimated right ventricular systolic pressure is 23.8 mmHg. Left Atrium: Left atrial size was normal in size. Right Atrium: Right atrial size was normal in size. Pericardium: There is no evidence of pericardial effusion. Mitral Valve: The mitral valve is normal in structure. There is mild calcification of the mitral valve leaflet(s). Moderate mitral valve regurgitation. No evidence of mitral valve stenosis. MV peak gradient, 5.0 mmHg. The mean mitral valve gradient is 2.0 mmHg. Tricuspid Valve: The tricuspid valve is normal in structure. Tricuspid valve regurgitation is mild . No evidence of tricuspid stenosis. Aortic Valve: The aortic valve is normal in structure. Aortic valve regurgitation is not visualized. No aortic stenosis is present. Aortic valve mean gradient measures 6.0 mmHg. Aortic valve peak gradient measures 12.2 mmHg. Aortic valve area, by VTI measures 1.54 cm. Pulmonic Valve: The pulmonic valve was normal in structure. Pulmonic valve regurgitation is not visualized. No evidence of  pulmonic stenosis. Aorta: The aortic root is normal in size and structure. Venous: The inferior vena cava is normal in size with greater than 50% respiratory variability, suggesting right atrial pressure of 3 mmHg. IAS/Shunts: No atrial level shunt detected by color flow Doppler. Additional Comments: 3D was performed not requiring image post processing on an independent workstation and was indeterminate.  LEFT VENTRICLE PLAX 2D LVIDd:         4.20 cm     Diastology LVIDs:         3.10 cm     LV e' medial:  12.10 cm/s LV PW:         0.90 cm     LV E/e' medial:  8.6 LV IVS:        1.00 cm     LV e' lateral:   14.00 cm/s LVOT diam:     1.70 cm     LV E/e' lateral: 7.4 LV SV:         53 LV SV Index:   34 LVOT Area:     2.27 cm  LV Volumes (MOD) LV vol d, MOD A2C: 66.7 ml LV vol d, MOD A4C: 77.5 ml LV vol s, MOD A2C: 30.3 ml LV vol s, MOD A4C: 34.9 ml LV SV MOD A2C:     36.4 ml LV SV MOD A4C:     77.5 ml LV SV MOD BP:      40.2 ml RIGHT VENTRICLE RV Basal diam:  2.80 cm RV Mid diam:    2.70 cm RV S prime:     13.20 cm/s TAPSE (M-mode): 2.1 cm LEFT ATRIUM             Index        RIGHT ATRIUM           Index LA diam:        3.60 cm 2.33 cm/m   RA Area:     12.60 cm LA Vol (A2C):   54.4 ml 35.20 ml/m  RA Volume:   24.90 ml  16.11 ml/m LA Vol (A4C):   46.3 ml 29.95 ml/m LA Biplane Vol: 52.0 ml 33.64 ml/m  AORTIC VALVE                     PULMONIC VALVE AV Area (Vmax):    1.60 cm      PV Vmax:        1.01 m/s AV Area (Vmean):   1.54 cm      PV Peak grad:   4.1 mmHg AV Area (VTI):     1.54 cm      RVOT Peak grad: 2 mmHg AV Vmax:           175.00 cm/s AV Vmean:          111.000 cm/s AV VTI:            0.343 m AV Peak Grad:      12.2 mmHg AV Mean Grad:      6.0 mmHg LVOT Vmax:         123.00 cm/s LVOT Vmean:        75.500 cm/s LVOT VTI:          0.233 m LVOT/AV VTI ratio: 0.68  AORTA Ao Root diam: 2.80 cm Ao Asc diam:  3.10 cm MITRAL VALVE                TRICUSPID VALVE MV Area (PHT): 3.93 cm     TR Peak grad:    18.8 mmHg MV Area VTI:   1.68 cm     TR Vmax:        217.00 cm/s MV Peak grad:  5.0 mmHg MV Mean grad:  2.0 mmHg     SHUNTS MV Vmax:       1.12 m/s     Systemic VTI:  0.23 m MV Vmean:      67.4 cm/s    Systemic Diam: 1.70 cm MV Decel Time: 193 msec MV E velocity: 104.00 cm/s MV A velocity: 88.00 cm/s MV E/A  ratio:  1.18 MV A Prime:    11.9 cm/s Evalene Lunger MD Electronically signed by Evalene Lunger MD Signature Date/Time: 10/16/2023/3:53:45 PM    Final    DG Chest 2 View Result Date: 10/15/2023 CLINICAL DATA:  Intermittent chest pain starting Wednesday. EXAM: CHEST - 2 VIEW COMPARISON:  None Available. FINDINGS: Cardiac silhouette and mediastinal contours are within normal limits. The lungs are clear. No pleural effusion or pneumothorax. Moderate multilevel disc space narrowing, endplate sclerosis, and peripheral osteophytosis of the thoracic spine. Minimal dextrocurvature of the upper lumbar spine. IMPRESSION: No active cardiopulmonary disease. Electronically Signed   By: Tanda Lyons M.D.   On: 10/15/2023 15:54     Echo normal left ventricular function EF of 60% no significant wall motion abnormalities  TELEMETRY: Sinus bradycardia rate of 55 nonspecific findings:  ASSESSMENT AND PLAN:  Principal Problem:   NSTEMI (non-ST elevated myocardial infarction) (HCC) Active Problems:   Essential hypertension   Depression   Class 1 obesity due to excess calories with body mass index (BMI) of 30.0 to 30.9 in adult    Plan Possible non-STEMI peak troponin around 350 with anginal symptoms recommend invasive evaluation with cardiac cath prior to discharge Echocardiogram for assessment left ventricular function and wall motion Hypertension management currently on amlodipine will consider converting to beta-blocker ACE or ARB Recommend continued aspirin  statin therapy Instituted Crestor therapy for lipid management post ACS Modest obesity recommend modest weight loss exercise portion  control   Cara JONETTA Lovelace, MD 10/17/2023 9:56 AM

## 2023-10-17 NOTE — Plan of Care (Signed)

## 2023-10-18 ENCOUNTER — Encounter
Admission: EM | Disposition: A | Discharge status: Home / Self Care | Payer: Self-pay | Source: Home / Self Care | Attending: Student

## 2023-10-18 ENCOUNTER — Other Ambulatory Visit: Payer: Self-pay

## 2023-10-18 HISTORY — PX: LEFT HEART CATH AND CORONARY ANGIOGRAPHY: CATH118249

## 2023-10-18 LAB — BASIC METABOLIC PANEL WITH GFR
Anion gap: 1 — ABNORMAL LOW (ref 5–15)
BUN: 16 mg/dL (ref 8–23)
CO2: 26 mmol/L (ref 22–32)
Calcium: 9.5 mg/dL (ref 8.9–10.3)
Chloride: 111 mmol/L (ref 98–111)
Creatinine, Ser: 0.48 mg/dL (ref 0.44–1.00)
GFR, Estimated: >60 mL/min (ref >60)
Glucose, Bld: 97 mg/dL (ref 70–99)
Potassium: 3.7 mmol/L (ref 3.5–5.1)
Sodium: 138 mmol/L (ref 135–145)

## 2023-10-18 LAB — CBC
HCT: 34.4 % — ABNORMAL LOW (ref 36.0–46.0)
Hemoglobin: 11.9 g/dL — ABNORMAL LOW (ref 12.0–15.0)
MCH: 31.3 pg (ref 26.0–34.0)
MCHC: 34.6 g/dL (ref 30.0–36.0)
MCV: 90.5 fL (ref 80.0–100.0)
Platelets: 203 10*3/uL (ref 150–400)
RBC: 3.8 MIL/uL — ABNORMAL LOW (ref 3.87–5.11)
RDW: 12.3 % (ref 11.5–15.5)
WBC: 6.5 10*3/uL (ref 4.0–10.5)
nRBC: 0 % (ref 0.0–0.2)

## 2023-10-18 LAB — LIPOPROTEIN A (LPA): Lipoprotein (a): 34.5 nmol/L — ABNORMAL HIGH (ref <75.0)

## 2023-10-18 LAB — HEPARIN LEVEL (UNFRACTIONATED): Heparin Unfractionated: 0.3 [IU]/mL (ref 0.30–0.70)

## 2023-10-18 LAB — MAGNESIUM: Magnesium: 1.9 mg/dL (ref 1.7–2.4)

## 2023-10-18 SURGERY — LEFT HEART CATH AND CORONARY ANGIOGRAPHY
Anesthesia: Moderate Sedation

## 2023-10-18 MED ORDER — METOPROLOL SUCCINATE ER 25 MG PO TB24
12.5000 mg | ORAL_TABLET | Freq: Every day | ORAL | 11 refills | Status: AC
  Filled 2023-10-18: qty 15, 30d supply, fill #0

## 2023-10-18 MED ORDER — MIDAZOLAM HCL 2 MG/2ML IJ SOLN
INTRAMUSCULAR | Status: AC
  Filled 2023-10-18: qty 2

## 2023-10-18 MED ORDER — VERAPAMIL HCL 2.5 MG/ML IV SOLN
INTRAVENOUS | Status: AC
  Filled 2023-10-18: qty 2

## 2023-10-18 MED ORDER — VERAPAMIL HCL 2.5 MG/ML IV SOLN
INTRAVENOUS | Status: DC | PRN
  Administered 2023-10-18: 2.5 mg via INTRA_ARTERIAL

## 2023-10-18 MED ORDER — FENTANYL CITRATE (PF) 100 MCG/2ML IJ SOLN
INTRAMUSCULAR | Status: AC
  Filled 2023-10-18: qty 2

## 2023-10-18 MED ORDER — CLOPIDOGREL BISULFATE 75 MG PO TABS
75.0000 mg | ORAL_TABLET | Freq: Every day | ORAL | 2 refills | Status: AC
  Filled 2023-10-18: qty 30, 30d supply, fill #0

## 2023-10-18 MED ORDER — METOPROLOL SUCCINATE ER 25 MG PO TB24: 12.5000 mg | ORAL_TABLET | Freq: Every day | ORAL | Status: DC

## 2023-10-18 MED ORDER — HEPARIN SODIUM (PORCINE) 1000 UNIT/ML IJ SOLN
INTRAMUSCULAR | Status: DC | PRN
  Administered 2023-10-18: 3000 [IU] via INTRAVENOUS

## 2023-10-18 MED ORDER — IOHEXOL 300 MG/ML  SOLN
INTRAMUSCULAR | Status: DC | PRN
  Administered 2023-10-18: 40 mL

## 2023-10-18 MED ORDER — MIDAZOLAM HCL 2 MG/2ML IJ SOLN
INTRAMUSCULAR | Status: DC | PRN
  Administered 2023-10-18: 1 mg via INTRAVENOUS

## 2023-10-18 MED ORDER — LIDOCAINE HCL (PF) 1 % IJ SOLN
INTRAMUSCULAR | Status: DC | PRN
  Administered 2023-10-18: 2 mL

## 2023-10-18 MED ORDER — SODIUM CHLORIDE 0.9 % WEIGHT BASED INFUSION
1.0000 mL/kg/h | INTRAVENOUS | Status: DC
  Administered 2023-10-18: 1 mL/kg/h via INTRAVENOUS

## 2023-10-18 MED ORDER — CLOPIDOGREL BISULFATE 75 MG PO TABS: 75.0000 mg | ORAL_TABLET | Freq: Every day | ORAL | Status: DC

## 2023-10-18 MED ORDER — ROSUVASTATIN CALCIUM 5 MG PO TABS: 5.0000 mg | ORAL_TABLET | Freq: Every day | ORAL | Status: DC

## 2023-10-18 MED ORDER — FENTANYL CITRATE (PF) 100 MCG/2ML IJ SOLN
INTRAMUSCULAR | Status: DC | PRN
  Administered 2023-10-18: 25 ug via INTRAVENOUS

## 2023-10-18 MED ORDER — ASPIRIN 81 MG PO TBEC
81.0000 mg | DELAYED_RELEASE_TABLET | Freq: Every day | ORAL | 11 refills | Status: AC
  Filled 2023-10-18: qty 30, 30d supply, fill #0

## 2023-10-18 MED ORDER — HEPARIN SODIUM (PORCINE) 1000 UNIT/ML IJ SOLN
INTRAMUSCULAR | Status: AC
  Filled 2023-10-18: qty 10

## 2023-10-18 MED ORDER — ROSUVASTATIN CALCIUM 5 MG PO TABS
5.0000 mg | ORAL_TABLET | Freq: Every day | ORAL | 11 refills | Status: AC
  Filled 2023-10-18: qty 30, 30d supply, fill #0

## 2023-10-18 MED ORDER — ISOSORBIDE MONONITRATE ER 30 MG PO TB24
30.0000 mg | ORAL_TABLET | Freq: Every day | ORAL | 11 refills | Status: AC
  Filled 2023-10-18: qty 30, 30d supply, fill #0

## 2023-10-18 MED ORDER — HEPARIN (PORCINE) IN NACL 1000-0.9 UT/500ML-% IV SOLN
INTRAVENOUS | Status: AC
  Filled 2023-10-18: qty 1000

## 2023-10-18 MED ORDER — HEPARIN (PORCINE) IN NACL 1000-0.9 UT/500ML-% IV SOLN
INTRAVENOUS | Status: DC | PRN
  Administered 2023-10-18: 1000 mL

## 2023-10-18 MED ORDER — LIDOCAINE HCL 1 % IJ SOLN
INTRAMUSCULAR | Status: AC
  Filled 2023-10-18: qty 20

## 2023-10-18 MED ORDER — ISOSORBIDE MONONITRATE ER 30 MG PO TB24: 30.0000 mg | ORAL_TABLET | Freq: Every day | ORAL | Status: DC

## 2023-10-18 SURGICAL SUPPLY — 9 items
CATH INFINITI 5 FR JL3.5 (CATHETERS) IMPLANT
CATH INFINITI JR4 5F (CATHETERS) IMPLANT
DEVICE RAD TR BAND REGULAR (VASCULAR PRODUCTS) IMPLANT
DRAPE BRACHIAL (DRAPES) IMPLANT
GLIDESHEATH SLEND SS 6F .021 (SHEATH) IMPLANT
GUIDEWIRE INQWIRE 1.5J.035X260 (WIRE) IMPLANT
PACK CARDIAC CATH (CUSTOM PROCEDURE TRAY) ×1 IMPLANT
SET ATX-X65L (MISCELLANEOUS) IMPLANT
STATION PROTECTION PRESSURIZED (MISCELLANEOUS) IMPLANT

## 2023-10-18 NOTE — TOC Initial Note (Signed)
 Transition of Care [facility]) - Initial/Assessment Note    Patient Details  Name: Angel Douglas MRN: [id number] Date of Birth: 1960/06/29  [facility] CM/SW Contact:    Keiarah Orlowski C Lin Glazier, RN Phone Number: 10/18/2023, 10:08 AM  Clinical Narrative:                 Transition of Care Department [facility]) has reviewed patient. We will continue to monitor patient advancement. If new patient transition needs arise, please place a TOC consult.        Patient Goals and CMS Choice            Expected Discharge Plan and Services                                              Prior Living Arrangements/Services                       Activities of Daily Living   ADL Screening (condition at time of admission) Independently performs ADLs?: Yes (appropriate for developmental age) Is the patient deaf or have difficulty hearing?: No Does the patient have difficulty seeing, even when wearing glasses/contacts?: No Does the patient have difficulty concentrating, remembering, or making decisions?: No  Permission Sought/Granted                  Emotional Assessment              Admission diagnosis:  NSTEMI (non-ST elevated myocardial infarction) (HCC) [I21.4] Chest pain, unspecified type [R07.9] Patient Active Problem List   Diagnosis Date Noted   NSTEMI (non-ST elevated myocardial infarction) (HCC) 10/15/2023   Essential hypertension 10/15/2023   Depression 10/15/2023   Class 1 obesity due to excess calories with body mass index (BMI) of 30.0 to 30.9 in adult 10/15/2023   Elevated BP without diagnosis of hypertension 12/30/2017   Varicose veins of both lower extremities with pain 09/21/2017   Claudication of calf muscles (HCC) 09/21/2017   Intractable pain 05/20/2017   PCP:  Lorel Maxie LABOR, MD Pharmacy:   [facility] - [place], [place] - [address] [place] [address] Phone: [phone] Fax:  [phone]     Social Drivers of Health (SDOH) Social History: SDOH Screenings   Food Insecurity: No Food Insecurity (10/15/2023)  Housing: Low Risk  (10/15/2023)  Transportation Needs: No Transportation Needs (10/15/2023)   Received from [facility]  Utilities: Not At Risk (10/15/2023)   Received from [facility]  Financial Resource Strain: Low Risk  (10/15/2023)   Received from [facility]  Social Connections: Socially Integrated (10/15/2023)  Tobacco Use: Medium Risk (10/15/2023)   SDOH Interventions:     Readmission Risk Interventions     No data to display

## 2023-10-18 NOTE — Progress Notes (Cosign Needed Addendum)
 [facility] CLINIC CARDIOLOGY PROGRESS NOTE       Patient ID: Angel Douglas MRN: [id number] DOB/AGE: Mar 30, 1961 63 y.o.  Admit date: 10/15/2023 Referring Physician Dr. Delon Herald Primary Physician Lorel Maxie LABOR, MD Primary Cardiologist None Reason for Consultation NSTEMI  HPI: Angel Douglas is a 63 y.o. female  with a past medical history of hypertension who presented to the ED on 10/15/2023 for chest pain for a few days. Chest pain worsened with moderate activity and would radiate to jaw, and wouldn't go away at rest. Patient has no known cardiac history, but has significant family history. Troponin found to be elevated upon admission. Cardiology was consulted for further evaluation.   Interval History: -Patient seen and examined this AM and laying comfortably in hospital bed . Patient states overall she feels well this AM and denies chest pain or SOB.  -Patients BP and HR stable this AM. Overnight Tele showed no significant events.  -Patient remains on room air with stable SpO2.  -Patient underwent LHC with Dr. Florencio this afternoon. Patient tolerated procedure well. No significant CAD, no intervention needed. -Right radial incision site is clean and dry with no evidence of significant swelling, bruising, or active bleeding.     Review of systems complete and found to be negative unless listed above    Past Medical History:  Diagnosis Date   Allergy    Depression    Hypertension    Preeclampsia     Past Surgical History:  Procedure Laterality Date   CESAREAN SECTION     FOOT SURGERY     JOINT REPLACEMENT     TUBAL LIGATION      Medications Prior to Admission  Medication Sig Dispense Refill Last Dose/Taking   ALLEGRA-D ALLERGY & CONGESTION 180-240 MG 24 hr tablet Take 1 tablet by mouth daily.   10/14/2023 Morning   amLODipine (NORVASC) 2.5 MG tablet Take 2.5 mg by mouth daily.   10/15/2023 at  8:30 AM   buPROPion (WELLBUTRIN XL) 300 MG 24 hr tablet Take 300 mg  by mouth daily.   10/15/2023   MOUNJARO 15 MG/0.5ML Pen    10/04/2023   Multiple Vitamin (MULTI-VITAMINS) TABS Take by mouth.   10/15/2023   acetaminophen  (TYLENOL ) 325 MG tablet Take 2 tablets (650 mg total) by mouth every 6 (six) hours as needed for mild pain (or Fever >/= 101). (Patient not taking: Reported on 09/21/2017) 20 tablet 0    docusate sodium  (COLACE) 100 MG capsule Take 1 capsule (100 mg total) by mouth 2 (two) times daily. (Patient not taking: Reported on 10/15/2023) 10 capsule 0 Not Taking   estradiol (ESTRACE) 0.1 MG/GM vaginal cream Place vaginally.      WEGOVY 2.4 MG/0.75ML SOAJ  (Patient not taking: Reported on 10/15/2023)   Not Taking   Social History   Socioeconomic History   Marital status: Married    Spouse name: Not on file   Number of children: Not on file   Years of education: Not on file   Highest education level: Not on file  Occupational History   Occupation: Production designer, theatre/television/film    Comment: Hydrologist [employer]  Tobacco Use   Smoking status: Former    Types: Cigarettes   Smokeless tobacco: Never   Tobacco comments:    Quit 25ya, 15 pack year  Vaping Use   Vaping status: Never Used  Substance and Sexual Activity   Alcohol use: Yes    Comment: occ   Drug use: Never  Sexual activity: Not on file  Other Topics Concern   Not on file  Social History Narrative   Not on file   Social Drivers of Health   Financial Resource Strain: Low Risk  (10/15/2023)   Received from [facility]   Overall Financial Resource Strain (CARDIA)    Difficulty of Paying Living Expenses: Not hard at all  Food Insecurity: No Food Insecurity (10/15/2023)   Hunger Vital Sign    Worried About Running Out of Food in the Last Year: Never true    Ran Out of Food in the Last Year: Never true  Transportation Needs: No Transportation Needs (10/15/2023)   Received [facility] Transportation    In the past 12 months, has lack of  transportation kept you from medical appointments or from getting medications?: No    Lack of Transportation (Non-Medical): No  Physical Activity: Not on file  Stress: Not on file  Social Connections: Socially Integrated (10/15/2023)   Social Connection and Isolation Panel    Frequency of Communication with Friends and Family: Three times a week    Frequency of Social Gatherings with Friends and Family: Three times a week    Attends Religious Services: 1 to 4 times per year    Active Member of Clubs or Organizations: No    Attends Banker Meetings: 1 to 4 times per year    Marital Status: Married  Catering manager Violence: Not At Risk (10/15/2023)   Humiliation, Afraid, Rape, and Kick questionnaire    Fear of Current or Ex-Partner: No    Emotionally Abused: No    Physically Abused: No    Sexually Abused: No    Family History  Problem Relation Age of Onset   Heart disease Mother 36       her first mini heart attack   COPD Mother    Diabetes Mellitus II Father    Diabetes Mellitus II Brother      Vitals:   10/17/23 1619 10/17/23 2039 10/18/23 0028 10/18/23 0403  BP: 100/68 (!) 108/55 114/61 (!) 124/51  Pulse: (!) 50 (!) 57  (!) 56  Resp: 17 16 18 18   Temp: 98.6 F (37 C) 98.3 F (36.8 C) 97.6 F (36.4 C) 98.1 F (36.7 C)  TempSrc: Oral Oral  Oral  SpO2: 90% 96% 97% 98%  Weight:      Height:        PHYSICAL EXAM General: Well appearing female, well nourished, in no acute distress. HEENT: Normocephalic and atraumatic. Neck: No JVD.   Lungs: Normal respiratory effort on room air. Clear bilaterally to auscultation. No wheezes, crackles, rhonchi.  Heart: HRRR. Normal S1 and S2 without gallops or murmurs.  Abdomen: Non-distended appearing.  Msk: Normal strength and tone for age. Extremities: Warm and well perfused. No clubbing, cyanosis. No edema.  Neuro: Alert and oriented X 3. Psych: Answers questions appropriately.   Labs: Basic Metabolic  Panel: Recent Labs    10/17/23 0508 10/18/23 0304  NA 139 138  K 3.8 3.7  CL 109 111  CO2 24 26  GLUCOSE 94 97  BUN 15 16  CREATININE 0.55 0.48  CALCIUM  9.6 9.5  MG 2.2 1.9  PHOS 4.1  --    Liver Function Tests: No results for input(s): AST, ALT, ALKPHOS, BILITOT, PROT, ALBUMIN in the last 72 hours. No results for input(s): LIPASE, AMYLASE in the last 72 hours. CBC: Recent Labs    10/17/23  9491 10/18/23 0304  WBC 6.5 6.5  HGB 12.4 11.9*  HCT 36.7 34.4*  MCV 91.5 90.5  PLT 206 203   Cardiac Enzymes: Recent Labs    10/15/23 1550 10/15/23 1936 10/16/23 1112  TROPONINIHS 310* 345* 93*   BNP: No results for input(s): BNP in the last 72 hours. D-Dimer: No results for input(s): DDIMER in the last 72 hours. Hemoglobin A1C: No results for input(s): HGBA1C in the last 72 hours. Fasting Lipid Panel: Recent Labs    10/16/23 0459  CHOL 154  HDL 54  LDLCALC 92  TRIG 42  CHOLHDL 2.9   Thyroid Function Tests: No results for input(s): TSH, T4TOTAL, T3FREE, THYROIDAB in the last 72 hours.  Invalid input(s): FREET3 Anemia Panel: No results for input(s): VITAMINB12, FOLATE, FERRITIN, TIBC, IRON, RETICCTPCT in the last 72 hours.   Radiology: ECHOCARDIOGRAM COMPLETE Result Date: 10/16/2023    ECHOCARDIOGRAM REPORT   Patient Name:   Angel Douglas Date of Exam: 10/16/2023 Medical Rec #:  [id number]          Height:       57.0 in Accession #:    [id number]         Weight:       140.0 lb Date of Birth:  07-Feb-1961          BSA:          1.546 m Patient Age:    62 years           BP:           115/58 mmHg Patient Gender: F                  HR:           59 bpm. Exam Location:  [facility] Procedure: 2D Echo, Cardiac Doppler and Color Doppler (Both Spectral and Color            Flow Doppler were utilized during procedure). Indications:     Chest Pain R07.9  History:         Patient has no prior history of Echocardiogram examinations.                   Risk Factors:Hypertension.  Sonographer:     Bari Roar Referring Phys:  [id number] DELON HERALD Diagnosing Phys: Timothy Gollan MD IMPRESSIONS  1. Left ventricular ejection fraction, by estimation, is 60 to 65%. The left ventricle has normal function. The left ventricle has no regional wall motion abnormalities. Left ventricular diastolic parameters were normal.  2. Right ventricular systolic function is normal. The right ventricular size is normal. There is normal pulmonary artery systolic pressure. The estimated right ventricular systolic pressure is 23.8 mmHg.  3. The mitral valve is normal in structure. Moderate mitral valve regurgitation. No evidence of mitral stenosis.  4. The aortic valve is normal in structure. Aortic valve regurgitation is not visualized. No aortic stenosis is present.  5. The inferior vena cava is normal in size with greater than 50% respiratory variability, suggesting right atrial pressure of 3 mmHg. FINDINGS  Left Ventricle: Left ventricular ejection fraction, by estimation, is 60 to 65%. The left ventricle has normal function. The left ventricle has no regional wall motion abnormalities. Strain was performed and the global longitudinal strain is indeterminate. The left ventricular internal cavity size was normal in size. There is no left ventricular hypertrophy. Left ventricular diastolic parameters were normal. Right Ventricle: The right ventricular size is normal. No increase in right  ventricular wall thickness. Right ventricular systolic function is normal. There is normal pulmonary artery systolic pressure. The tricuspid regurgitant velocity is 2.17 m/s, and  with an assumed right atrial pressure of 5 mmHg, the estimated right ventricular systolic pressure is 23.8 mmHg. Left Atrium: Left atrial size was normal in size. Right Atrium: Right atrial size was normal in size. Pericardium: There is no evidence of pericardial effusion. Mitral Valve: The mitral valve is normal in  structure. There is mild calcification of the mitral valve leaflet(s). Moderate mitral valve regurgitation. No evidence of mitral valve stenosis. MV peak gradient, 5.0 mmHg. The mean mitral valve gradient is 2.0 mmHg. Tricuspid Valve: The tricuspid valve is normal in structure. Tricuspid valve regurgitation is mild . No evidence of tricuspid stenosis. Aortic Valve: The aortic valve is normal in structure. Aortic valve regurgitation is not visualized. No aortic stenosis is present. Aortic valve mean gradient measures 6.0 mmHg. Aortic valve peak gradient measures 12.2 mmHg. Aortic valve area, by VTI measures 1.54 cm. Pulmonic Valve: The pulmonic valve was normal in structure. Pulmonic valve regurgitation is not visualized. No evidence of pulmonic stenosis. Aorta: The aortic root is normal in size and structure. Venous: The inferior vena cava is normal in size with greater than 50% respiratory variability, suggesting right atrial pressure of 3 mmHg. IAS/Shunts: No atrial level shunt detected by color flow Doppler. Additional Comments: 3D was performed not requiring image post processing on an independent workstation and was indeterminate.  LEFT VENTRICLE PLAX 2D LVIDd:         4.20 cm     Diastology LVIDs:         3.10 cm     LV e' medial:    12.10 cm/s LV PW:         0.90 cm     LV E/e' medial:  8.6 LV IVS:        1.00 cm     LV e' lateral:   14.00 cm/s LVOT diam:     1.70 cm     LV E/e' lateral: 7.4 LV SV:         53 LV SV Index:   34 LVOT Area:     2.27 cm  LV Volumes (MOD) LV vol d, MOD A2C: 66.7 ml LV vol d, MOD A4C: 77.5 ml LV vol s, MOD A2C: 30.3 ml LV vol s, MOD A4C: 34.9 ml LV SV MOD A2C:     36.4 ml LV SV MOD A4C:     77.5 ml LV SV MOD BP:      40.2 ml RIGHT VENTRICLE RV Basal diam:  2.80 cm RV Mid diam:    2.70 cm RV S prime:     13.20 cm/s TAPSE (M-mode): 2.1 cm LEFT ATRIUM             Index        RIGHT ATRIUM           Index LA diam:        3.60 cm 2.33 cm/m   RA Area:     12.60 cm LA Vol (A2C):    54.4 ml 35.20 ml/m  RA Volume:   24.90 ml  16.11 ml/m LA Vol (A4C):   46.3 ml 29.95 ml/m LA Biplane Vol: 52.0 ml 33.64 ml/m  AORTIC VALVE                     PULMONIC VALVE AV Area (Vmax):    1.60 cm  PV Vmax:        1.01 m/s AV Area (Vmean):   1.54 cm      PV Peak grad:   4.1 mmHg AV Area (VTI):     1.54 cm      RVOT Peak grad: 2 mmHg AV Vmax:           175.00 cm/s AV Vmean:          111.000 cm/s AV VTI:            0.343 m AV Peak Grad:      12.2 mmHg AV Mean Grad:      6.0 mmHg LVOT Vmax:         123.00 cm/s LVOT Vmean:        75.500 cm/s LVOT VTI:          0.233 m LVOT/AV VTI ratio: 0.68  AORTA Ao Root diam: 2.80 cm Ao Asc diam:  3.10 cm MITRAL VALVE                TRICUSPID VALVE MV Area (PHT): 3.93 cm     TR Peak grad:   18.8 mmHg MV Area VTI:   1.68 cm     TR Vmax:        217.00 cm/s MV Peak grad:  5.0 mmHg MV Mean grad:  2.0 mmHg     SHUNTS MV Vmax:       1.12 m/s     Systemic VTI:  0.23 m MV Vmean:      67.4 cm/s    Systemic Diam: 1.70 cm MV Decel Time: 193 msec MV E velocity: 104.00 cm/s MV A velocity: 88.00 cm/s MV E/A ratio:  1.18 MV A Prime:    11.9 cm/s Evalene Lunger MD Electronically signed by Evalene Lunger MD Signature Date/Time: 10/16/2023/3:53:45 PM    Final    DG Chest 2 View Result Date: 10/15/2023 CLINICAL DATA:  Intermittent chest pain starting Wednesday. EXAM: CHEST - 2 VIEW COMPARISON:  None Available. FINDINGS: Cardiac silhouette and mediastinal contours are within normal limits. The lungs are clear. No pleural effusion or pneumothorax. Moderate multilevel disc space narrowing, endplate sclerosis, and peripheral osteophytosis of the thoracic spine. Minimal dextrocurvature of the upper lumbar spine. IMPRESSION: No active cardiopulmonary disease. Electronically Signed   By: Tanda Lyons M.D.   On: 10/15/2023 15:54    ECHO as above  TELEMETRY reviewed by me 10/18/2023: sinus rhythm, rate 50s  EKG reviewed by me: Normal sinus rhythm nonspecific ST-T changes rate of 70    Data reviewed by me 10/18/2023: last 24h vitals tele labs imaging I/O hospitalist progress notes.  Principal Problem:   NSTEMI (non-ST elevated myocardial infarction) (HCC) Active Problems:   Essential hypertension   Depression   Class 1 obesity due to excess calories with body mass index (BMI) of 30.0 to 30.9 in adult    ASSESSMENT AND PLAN:  Angel Douglas is a 63 y.o. female  with a past medical history of hypertension who presented to the ED on 10/15/2023 for chest pain for a few days. Chest pain worsened with moderate activity and would radiate to jaw, and wouldn't go away at rest. Patient has no known cardiac history, but has significant family history. Troponin found to be elevated upon admission. Cardiology was consulted for further evaluation.   # NSTEMI # Hypertension # Family history of arteriosclerosis vascular disease Patient presents with worsening chest pain that radiates to jaw. Lipid panel on 06/21 within normal limits  with LDL of 92, cholesterol of 845. EKG without acute ischemic changes. Trops elevated and trended  96 > 310 > 345 > 93. Echo this admission with pEF, no RWMA, no significant valvular dysfunction. Patient underwent LHC with Dr. Florencio this afternoon (06/23). Patient tolerated procedure well, no significant CAD, no intervention needed. -D/c heparin  gtt. -Continue ASA 81 mg. -Ordered Plavix 75 mg daily. -Ordered Crestor 5 mg daily. -Ordered metoprolol  succinate 12.5 mg daily to start tomorrow. -Ordered Imdur 30 mg daily.  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   Ok for discharge today from a cardiac perspective. Will arrange for follow up in clinic with Dr. Florencio in 1-2 weeks.   This patient's plan of care was discussed and created with Dr. Florencio and he is in agreement.  Signed: Dorene Comfort, PA-C  10/18/2023, 6:40 AM [facility] Cardiology

## 2023-10-18 NOTE — Plan of Care (Signed)
 Pt is alert and oriented x 4. Up adlib. Denies pain. Vitals stable. Heparin  and NS infusing.  Problem: Education: Goal: Understanding of cardiac disease, CV risk reduction, and recovery process will improve Outcome: Progressing   Problem: Activity: Goal: Ability to tolerate increased activity will improve Outcome: Progressing   Problem: Cardiac: Goal: Ability to achieve and maintain adequate cardiovascular perfusion will improve Outcome: Progressing   Problem: Health Behavior/Discharge Planning: Goal: Ability to safely manage health-related needs after discharge will improve Outcome: Progressing   Problem: Education: Goal: Knowledge of General Education information will improve Description: Including pain rating scale, medication(s)/side effects and non-pharmacologic comfort measures Outcome: Progressing   Problem: Health Behavior/Discharge Planning: Goal: Ability to manage health-related needs will improve Outcome: Progressing   Problem: Clinical Measurements: Goal: Ability to maintain clinical measurements within normal limits will improve Outcome: Progressing Goal: Will remain free from infection Outcome: Progressing Goal: Diagnostic test results will improve Outcome: Progressing Goal: Respiratory complications will improve Outcome: Progressing Goal: Cardiovascular complication will be avoided Outcome: Progressing   Problem: Activity: Goal: Risk for activity intolerance will decrease Outcome: Progressing   Problem: Nutrition: Goal: Adequate nutrition will be maintained Outcome: Progressing   Problem: Coping: Goal: Level of anxiety will decrease Outcome: Progressing   Problem: Elimination: Goal: Will not experience complications related to bowel motility Outcome: Progressing Goal: Will not experience complications related to urinary retention Outcome: Progressing   Problem: Pain Managment: Goal: General experience of comfort will improve and/or be  controlled Outcome: Progressing   Problem: Safety: Goal: Ability to remain free from injury will improve Outcome: Progressing   Problem: Skin Integrity: Goal: Risk for impaired skin integrity will decrease Outcome: Progressing   Problem: Education: Goal: Understanding of CV disease, CV risk reduction, and recovery process will improve Outcome: Progressing   Problem: Activity: Goal: Ability to return to baseline activity level will improve Outcome: Progressing   Problem: Cardiovascular: Goal: Ability to achieve and maintain adequate cardiovascular perfusion will improve Outcome: Progressing Goal: Vascular access site(s) Level 0-1 will be maintained Outcome: Progressing   Problem: Health Behavior/Discharge Planning: Goal: Ability to safely manage health-related needs after discharge will improve Outcome: Progressing

## 2023-10-18 NOTE — Discharge Summary (Signed)
 [facility] Discharge Summary   Patient: Angel Douglas FMW:[id number]  PCP: Lorel Maxie LABOR, MD  Date of admission: 10/15/2023   Date of discharge:  10/18/2023     Discharge Diagnoses:  Principal Problem:   NSTEMI (non-ST elevated myocardial infarction) [facility]) Active Problems:   Essential hypertension   Depression   Class 1 obesity due to excess calories with body mass index (BMI) of 30.0 to 30.9 in adult   Admitted From: Home Disposition:  Home   Recommendations for Outpatient Follow-up:  PCP: in 1 wk F/u Cardio in 1-2 weeks  Follow up LABS/TEST: CBC in 1 -2 weeks    Follow-up Information     Callwood, Cara D, MD. Go in 1 week(s).   Specialties: Cardiology, Internal Medicine Contact information: [address] [place] [address] [phone]         Lorel Maxie LABOR, MD Follow up in 1 week(s).   Specialty: Family Medicine Contact information: [address] [place] [address] [phone]                Diet recommendation: Cardiac diet  Activity: The patient is advised to gradually reintroduce usual activities, as tolerated  Discharge Condition: stable  Code Status: Full code   History of present illness: As per the H and P dictated on admission. Hospital Course:  Angel Douglas is a 63 y.o. female with medical history significant of HTN and depression who presented on 6/20 with chest pain.  Patient had off-and-on chest pain for few days mostly on exertion which resolved at rest.  Reviewed H&P details.   ED workup: VS stable Trop 96--310--345--93 Cardiology consulted PRS consulted for admission and further management as below   Assessment and Plan:   # NSTEMI Presented with exertional chest pain, improved at rest Troponin peaked at 345, trending down Currently patient is asymptomatic Started Aspirin  81 mg p.o. daily, Lipitor 20 mg p.o. daily and Lopressor  12.5 mg p.o. twice daily. Nitroglycerin  prn S/p  Heparin  IV infusion TTE LVEF 6065%, no LV WMA, no any significant valvular abnormality. Cardiology consulted, s/p cardiac cath, nonobstructive CAD.  No intervention done.  Recommended medical management. Patient was discharged on DAPT, aspirin  81 mg, Plavix 75 mg p.o. daily, Imdur 30 mg p.o. daily, Toprol -XL 12.5 mg p.o. daily.  Crestor 5 mg p.o. daily. Patient was advised to follow with PCP, repeat CBC in 1 week and follow-up with cardiology in 1 to 2 weeks.  Continue to monitor BP at home.  # Depression, continued Wellbutrin  Body mass index is 30.3 kg/m.  Nutrition Interventions:  - Patient was instructed, not to drive, operate heavy machinery, perform activities at heights, swimming or participation in water activities or provide baby sitting services while on Pain, Sleep and Anxiety Medications; until her outpatient Physician has advised to do so again.  - Also recommended to not to take more than prescribed Pain, Sleep and Anxiety Medications.  Patient was ambulatory without any assistance. On the day of the discharge the patient's vitals were stable, and no other acute medical condition were reported by patient. the patient was felt safe to be discharge at Home.  Consultants: Cardiology Procedures: s/p LHC, cardiac cath, nonobstructive CAD.  Medical management recommended by cardiology  Discharge Exam: General: Appear in no distress, no Rash; Oral Mucosa Clear, moist. Cardiovascular: S1 and S2 Present, no Murmur, Respiratory: normal respiratory effort, Bilateral Air entry present and no Crackles, no wheezes Abdomen: Bowel Sound present, Soft and no tenderness,  no hernia Extremities: no Pedal edema, no calf tenderness Neurology: alert and oriented to time, place, and person affect appropriate.  Filed Weights   10/15/23 1333  Weight: 63.5 kg   Vitals:   10/18/23 1500 10/18/23 1514  BP: (!) 109/51 107/87  Pulse: (!) 59 (!) 54  Resp: 18 15  Temp:  98 F (36.7 C)  SpO2: 97%  98%    DISCHARGE MEDICATION: Allergies as of 10/18/2023       Reactions   Morphine  Hives   itching        Medication List     STOP taking these medications    amLODipine 2.5 MG tablet Commonly known as: NORVASC   docusate sodium  100 MG capsule Commonly known as: COLACE   Wegovy 2.4 MG/0.75ML Soaj Generic drug: Semaglutide-Weight Management       TAKE these medications    acetaminophen  325 MG tablet Commonly known as: TYLENOL  Take 2 tablets (650 mg total) by mouth every 6 (six) hours as needed for mild pain (or Fever >/= 101).   Allegra-D Allergy & Congestion 180-240 MG 24 hr tablet Generic drug: fexofenadine-pseudoephedrine Take 1 tablet by mouth daily.   aspirin  EC 81 MG tablet Take 1 tablet (81 mg total) by mouth daily. Swallow whole. Start taking on: October 19, 2023   buPROPion 300 MG 24 hr tablet Commonly known as: WELLBUTRIN XL Take 300 mg by mouth daily.   clopidogrel 75 MG tablet Commonly known as: PLAVIX Take 1 tablet (75 mg total) by mouth daily.   estradiol 0.1 MG/GM vaginal cream Commonly known as: ESTRACE Place vaginally.   isosorbide mononitrate 30 MG 24 hr tablet Commonly known as: IMDUR Take 1 tablet (30 mg total) by mouth daily.   metoprolol  succinate 25 MG 24 hr tablet Commonly known as: TOPROL -XL Take 0.5 tablets (12.5 mg total) by mouth daily. Start taking on: October 19, 2023   Mounjaro 15 MG/0.5ML Pen Generic drug: tirzepatide   Multi-Vitamins Tabs Take by mouth.   rosuvastatin 5 MG tablet Commonly known as: CRESTOR Take 1 tablet (5 mg total) by mouth daily.       Allergies  Allergen Reactions   Morphine  Hives    itching   Discharge Instructions     AMB referral to Phase II Cardiac Rehabilitation   Complete by: As directed    Diagnosis: NSTEMI   After initial evaluation and assessments completed: Virtual Based Care may be provided alone or in conjunction with Phase 2 Cardiac Rehab based on patient barriers.: Yes    Intensive Cardiac Rehabilitation (ICR) [facility] location only OR Traditional Cardiac Rehabilitation (TCR) *If criteria for ICR are not met will enroll in TCR [facility] only): Yes   Call MD for:  difficulty breathing, headache or visual disturbances   Complete by: As directed    Call MD for:  extreme fatigue   Complete by: As directed    Call MD for:  persistant dizziness or light-headedness   Complete by: As directed    Call MD for:  severe uncontrolled pain   Complete by: As directed    Diet - low sodium heart healthy   Complete by: As directed    Discharge instructions   Complete by: As directed    Follow-up with PCP in 1 week Follow-up with cardiology in 1 to 2 weeks, monitor BP at home and follow-up with cards to titrate medication accordingly.   Increase activity slowly   Complete by: As directed  The results of significant diagnostics from this hospitalization (including imaging, microbiology, ancillary and laboratory) are listed below for reference.    Significant Diagnostic Studies: ECHOCARDIOGRAM COMPLETE Result Date: 10/16/2023    ECHOCARDIOGRAM REPORT   Patient Name:   Angel Douglas Date of Exam: 10/16/2023 Medical Rec #:  [id number]          Height:       57.0 in Accession #:    [id number]         Weight:       140.0 lb Date of Birth:  Jul 29, 1960          BSA:          1.546 m Patient Age:    62 years           BP:           115/58 mmHg Patient Gender: F                  HR:           59 bpm. Exam Location:  [facility] Procedure: 2D Echo, Cardiac Doppler and Color Doppler (Both Spectral and Color            Flow Doppler were utilized during procedure). Indications:     Chest Pain R07.9  History:         Patient has no prior history of Echocardiogram examinations.                  Risk Factors:Hypertension.  Sonographer:     Bari Roar Referring Phys:  [id number] DELON HERALD Diagnosing Phys: Timothy Gollan MD IMPRESSIONS  1. Left ventricular ejection fraction, by estimation, is 60 to 65%.  The left ventricle has normal function. The left ventricle has no regional wall motion abnormalities. Left ventricular diastolic parameters were normal.  2. Right ventricular systolic function is normal. The right ventricular size is normal. There is normal pulmonary artery systolic pressure. The estimated right ventricular systolic pressure is 23.8 mmHg.  3. The mitral valve is normal in structure. Moderate mitral valve regurgitation. No evidence of mitral stenosis.  4. The aortic valve is normal in structure. Aortic valve regurgitation is not visualized. No aortic stenosis is present.  5. The inferior vena cava is normal in size with greater than 50% respiratory variability, suggesting right atrial pressure of 3 mmHg. FINDINGS  Left Ventricle: Left ventricular ejection fraction, by estimation, is 60 to 65%. The left ventricle has normal function. The left ventricle has no regional wall motion abnormalities. Strain was performed and the global longitudinal strain is indeterminate. The left ventricular internal cavity size was normal in size. There is no left ventricular hypertrophy. Left ventricular diastolic parameters were normal. Right Ventricle: The right ventricular size is normal. No increase in right ventricular wall thickness. Right ventricular systolic function is normal. There is normal pulmonary artery systolic pressure. The tricuspid regurgitant velocity is 2.17 m/s, and  with an assumed right atrial pressure of 5 mmHg, the estimated right ventricular systolic pressure is 23.8 mmHg. Left Atrium: Left atrial size was normal in size. Right Atrium: Right atrial size was normal in size. Pericardium: There is no evidence of pericardial effusion. Mitral Valve: The mitral valve is normal in structure. There is mild calcification of the mitral valve leaflet(s). Moderate mitral valve regurgitation. No evidence of mitral valve stenosis. MV peak gradient, 5.0 mmHg. The mean mitral valve gradient is 2.0 mmHg.  Tricuspid Valve: The tricuspid valve is normal in structure. Tricuspid  valve regurgitation is mild . No evidence of tricuspid stenosis. Aortic Valve: The aortic valve is normal in structure. Aortic valve regurgitation is not visualized. No aortic stenosis is present. Aortic valve mean gradient measures 6.0 mmHg. Aortic valve peak gradient measures 12.2 mmHg. Aortic valve area, by VTI measures 1.54 cm. Pulmonic Valve: The pulmonic valve was normal in structure. Pulmonic valve regurgitation is not visualized. No evidence of pulmonic stenosis. Aorta: The aortic root is normal in size and structure. Venous: The inferior vena cava is normal in size with greater than 50% respiratory variability, suggesting right atrial pressure of 3 mmHg. IAS/Shunts: No atrial level shunt detected by color flow Doppler. Additional Comments: 3D was performed not requiring image post processing on an independent workstation and was indeterminate.  LEFT VENTRICLE PLAX 2D LVIDd:         4.20 cm     Diastology LVIDs:         3.10 cm     LV e' medial:    12.10 cm/s LV PW:         0.90 cm     LV E/e' medial:  8.6 LV IVS:        1.00 cm     LV e' lateral:   14.00 cm/s LVOT diam:     1.70 cm     LV E/e' lateral: 7.4 LV SV:         53 LV SV Index:   34 LVOT Area:     2.27 cm  LV Volumes (MOD) LV vol d, MOD A2C: 66.7 ml LV vol d, MOD A4C: 77.5 ml LV vol s, MOD A2C: 30.3 ml LV vol s, MOD A4C: 34.9 ml LV SV MOD A2C:     36.4 ml LV SV MOD A4C:     77.5 ml LV SV MOD BP:      40.2 ml RIGHT VENTRICLE RV Basal diam:  2.80 cm RV Mid diam:    2.70 cm RV S prime:     13.20 cm/s TAPSE (M-mode): 2.1 cm LEFT ATRIUM             Index        RIGHT ATRIUM           Index LA diam:        3.60 cm 2.33 cm/m   RA Area:     12.60 cm LA Vol (A2C):   54.4 ml 35.20 ml/m  RA Volume:   24.90 ml  16.11 ml/m LA Vol (A4C):   46.3 ml 29.95 ml/m LA Biplane Vol: 52.0 ml 33.64 ml/m  AORTIC VALVE                     PULMONIC VALVE AV Area (Vmax):    1.60 cm      PV Vmax:         1.01 m/s AV Area (Vmean):   1.54 cm      PV Peak grad:   4.1 mmHg AV Area (VTI):     1.54 cm      RVOT Peak grad: 2 mmHg AV Vmax:           175.00 cm/s AV Vmean:          111.000 cm/s AV VTI:            0.343 m AV Peak Grad:      12.2 mmHg AV Mean Grad:      6.0 mmHg LVOT Vmax:  123.00 cm/s LVOT Vmean:        75.500 cm/s LVOT VTI:          0.233 m LVOT/AV VTI ratio: 0.68  AORTA Ao Root diam: 2.80 cm Ao Asc diam:  3.10 cm MITRAL VALVE                TRICUSPID VALVE MV Area (PHT): 3.93 cm     TR Peak grad:   18.8 mmHg MV Area VTI:   1.68 cm     TR Vmax:        217.00 cm/s MV Peak grad:  5.0 mmHg MV Mean grad:  2.0 mmHg     SHUNTS MV Vmax:       1.12 m/s     Systemic VTI:  0.23 m MV Vmean:      67.4 cm/s    Systemic Diam: 1.70 cm MV Decel Time: 193 msec MV E velocity: 104.00 cm/s MV A velocity: 88.00 cm/s MV E/A ratio:  1.18 MV A Prime:    11.9 cm/s Evalene Lunger MD Electronically signed by Evalene Lunger MD Signature Date/Time: 10/16/2023/3:53:45 PM    Final    DG Chest 2 View Result Date: 10/15/2023 CLINICAL DATA:  Intermittent chest pain starting Wednesday. EXAM: CHEST - 2 VIEW COMPARISON:  None Available. FINDINGS: Cardiac silhouette and mediastinal contours are within normal limits. The lungs are clear. No pleural effusion or pneumothorax. Moderate multilevel disc space narrowing, endplate sclerosis, and peripheral osteophytosis of the thoracic spine. Minimal dextrocurvature of the upper lumbar spine. IMPRESSION: No active cardiopulmonary disease. Electronically Signed   By: Tanda Lyons M.D.   On: 10/15/2023 15:54    Microbiology: No results found for this or any previous visit (from the past 240 hours).   Labs: CBC: Recent Labs  Lab 10/15/23 1337 10/16/23 0459 10/17/23 0508 10/18/23 0304  WBC 6.8 5.3 6.5 6.5  HGB 13.4 12.3 12.4 11.9*  HCT 37.9 36.6 36.7 34.4*  MCV 91.8 92.4 91.5 90.5  PLT 232 217 206 203   Basic Metabolic Panel: Recent Labs  Lab 10/15/23 1337  10/16/23 0459 10/17/23 0508 10/18/23 0304  NA 140 138 139 138  K 4.2 4.6 3.8 3.7  CL 109 111 109 111  CO2 26 23 24 26   GLUCOSE 88 92 94 97  BUN 10 12 15 16   CREATININE 0.49 0.54 0.55 0.48  CALCIUM  9.7 9.7 9.6 9.5  MG  --   --  2.2 1.9  PHOS  --   --  4.1  --    Liver Function Tests: No results for input(s): AST, ALT, ALKPHOS, BILITOT, PROT, ALBUMIN in the last 168 hours. No results for input(s): LIPASE, AMYLASE in the last 168 hours. No results for input(s): AMMONIA in the last 168 hours. Cardiac Enzymes: No results for input(s): CKTOTAL, CKMB, CKMBINDEX, TROPONINI in the last 168 hours. BNP (last 3 results) No results for input(s): BNP in the last 8760 hours. CBG: No results for input(s): GLUCAP in the last 168 hours.  Time spent: 35 minutes  Signed:  Elvan Sor  [facility] 10/18/2023 3:29 PM

## 2023-10-18 NOTE — Consult Note (Signed)
 PHARMACY - ANTICOAGULATION CONSULT NOTE  Pharmacy Consult for Heparin  Indication: chest pain/ACS  Allergies  Allergen Reactions   Morphine  Hives    itching    Patient Measurements: Height: 4' 9 (144.8 cm) Weight: 63.5 kg (140 lb) IBW/kg (Calculated) : 38.6 HEPARIN  DW (KG): 52.8  Vital Signs: Temp: 97.6 F (36.4 C) (06/23 0028) Temp Source: Oral (06/22 2039) BP: 114/61 (06/23 0028) Pulse Rate: 57 (06/22 2039)  Labs: Recent Labs    10/15/23 1337 10/15/23 1550 10/15/23 1936 10/15/23 2258 10/16/23 0459 10/16/23 1112 10/16/23 1653 10/17/23 0508 10/18/23 0304  HGB 13.4  --   --   --  12.3  --   --  12.4 11.9*  HCT 37.9  --   --   --  36.6  --   --  36.7 34.4*  PLT 232  --   --   --  217  --   --  206 203  APTT 27  --   --   --   --   --   --   --   --   LABPROT 13.1  --   --   --   --   --   --   --   --   INR 1.0  --   --   --   --   --   --   --   --   HEPARINUNFRC  --   --   --    < > 0.45 0.40 0.36 0.33 0.30  CREATININE 0.49  --   --   --  0.54  --   --  0.55  --   TROPONINIHS 96* 310* 345*  --   --  93*  --   --   --    < > = values in this interval not displayed.    Estimated Creatinine Clearance: 55.9 mL/min (by C-G formula based on SCr of 0.55 mg/dL).   Medical History: Past Medical History:  Diagnosis Date   Allergy    Depression    Hypertension    Preeclampsia     Medications:  No history of chronic anticoagulant use PTA  Assessment: 63 y.o. female past medical history significant for hypertension, depression, who presents to the emergency department with chest pain.  Chest pain started on Wednesday while she was at rest, she went to lay down and her chest pain resolved. Troponin levels 96-->310, Pharmacy has been consulted to initiate and dose continuous heparin  infusion.  Baseline labs: aPTT 27 sec, INR 1.0, Plts 232, Hgb 13.4  Goal of Therapy:  Heparin  level 0.3-0.7 units/ml Monitor platelets by anticoagulation protocol: Yes  6/20:  HL @  2258 = 0.17, SUBtherapeutic 6/21:  HL @ 0459 = 0.45, therapeutic X 1 6/21:  HL @ 1112 = 0.40, therapeutic  X 2 6/21:  HL @ 1653 = 0.36, therapeutic X 3 6/22:  HL @ 0508 = 0.33, therapeutic X 4  6/23:  HL @ 0304 = 0.30, therapeutic X 5 Plan:  Heparin  level therapeutic but at lowest end of range and trending down for past 2 days Will increase drip rate slightly to 900 units/hr and recheck HL on 6/24 with AM labs.  CBC daily while on heparin   Karesha Trzcinski D, PharmD 10/18/2023,3:57 AM

## 2023-10-19 LAB — CARDIAC CATHETERIZATION: Cath EF Quantitative: 55 %

## 2023-10-20 ENCOUNTER — Encounter: Payer: Self-pay | Admitting: Internal Medicine

## 2023-10-27 ENCOUNTER — Encounter: Attending: Internal Medicine

## 2023-10-27 ENCOUNTER — Other Ambulatory Visit: Payer: Self-pay

## 2023-10-27 DIAGNOSIS — I214 Non-ST elevation (NSTEMI) myocardial infarction: Secondary | ICD-10-CM | POA: Insufficient documentation

## 2023-10-27 NOTE — Progress Notes (Signed)
 Virtual Visit completed. Patient informed on EP and RD appointment and 6 Minute walk test. Patient also informed of patient health questionnaires on My Chart. Patient Verbalizes understanding. Visit diagnosis can be found in [facility] 10/15/2023.

## 2023-11-02 ENCOUNTER — Encounter

## 2023-11-02 VITALS — Ht 60.0 in | Wt 137.0 lb

## 2023-11-02 DIAGNOSIS — I214 Non-ST elevation (NSTEMI) myocardial infarction: Secondary | ICD-10-CM | POA: Diagnosis present

## 2023-11-02 NOTE — Patient Instructions (Signed)
 Patient Instructions  Patient Details  Name: Angel Douglas MRN: [id number] Date of Birth: 1960/08/29 Referring Provider:  Florencio Cara BIRCH, MD  Below are your personal goals for exercise, nutrition, and risk factors. Our goal is to help you stay on track towards obtaining and maintaining these goals. We will be discussing your progress on these goals with you throughout the program.  Initial Exercise Prescription:  Initial Exercise Prescription - 11/02/23 1500       Date of Initial Exercise RX and Referring Provider   Date 11/02/23    Referring Provider Dr. Cara Florencio      Oxygen   Maintain Oxygen Saturation 88% or higher      Treadmill   MPH 2.7    Grade 0    Minutes 15    METs 3.07      NuStep   Level 2    SPM 80    Minutes 15    METs 3.43      REL-XR   Level 2    Watts 25    Speed 50    Minutes 15    METs 3.43      T5 Nustep   Level 2   T6   SPM 80    Minutes 15    METs 3.43      Prescription Details   Duration Progress to 30 minutes of continuous aerobic without signs/symptoms of physical distress      Intensity   THRR 40-80% of Max Heartrate 101-139    Ratings of Perceived Exertion 11-13    Perceived Dyspnea 0-4      Progression   Progression Continue to progress workloads to maintain intensity without signs/symptoms of physical distress.      Resistance Training   Training Prescription Yes    Weight 7lb    Reps 10-15          Exercise Goals: Frequency: Be able to perform aerobic exercise two to three times per week in program working toward 2-5 days per week of home exercise.  Intensity: Work with a perceived exertion of 11 (fairly light) - 15 (hard) while following your exercise prescription.  We will make changes to your prescription with you as you progress through the program.   Duration: Be able to do 30 to 45 minutes of continuous aerobic exercise in addition to a 5 minute warm-up and a 5 minute cool-down routine.    Nutrition Goals: Your personal nutrition goals will be established when you do your nutrition analysis with the dietician.  The following are general nutrition guidelines to follow: Cholesterol < 200mg /day Sodium < 1500mg /day Fiber: Women over 50 yrs - 21 grams per day  Personal Goals:  Personal Goals and Risk Factors at Admission - 10/27/23 1426       Core Components/Risk Factors/Patient Goals on Admission    Weight Management Yes;Weight Loss    Intervention Weight Management: Develop a combined nutrition and exercise program designed to reach desired caloric intake, while maintaining appropriate intake of nutrient and fiber, sodium and fats, and appropriate energy expenditure required for the weight goal.;Weight Management: Provide education and appropriate resources to help participant work on and attain dietary goals.;Weight Management/Obesity: Establish reasonable short term and long term weight goals.    Expected Outcomes Short Term: Continue to assess and modify interventions until short term weight is achieved;Weight Loss: Understanding of general recommendations for a balanced deficit meal plan, which promotes 1-2 lb weight loss per week and includes a negative energy  balance of [phone] kcal/d;Understanding recommendations for meals to include 15-35% energy as protein, 25-35% energy from fat, 35-60% energy from carbohydrates, less than 200mg  of dietary cholesterol, 20-35 gm of total fiber daily;Understanding of distribution of calorie intake throughout the day with the consumption of 4-5 meals/snacks    Hypertension Yes    Intervention Provide education on lifestyle modifcations including regular physical activity/exercise, weight management, moderate sodium restriction and increased consumption of fresh fruit, vegetables, and low fat dairy, alcohol moderation, and smoking cessation.;Monitor prescription use compliance.    Expected Outcomes Short Term: Continued assessment and  intervention until BP is < 140/34mm HG in hypertensive participants. < 130/69mm HG in hypertensive participants with diabetes, heart failure or chronic kidney disease.;Long Term: Maintenance of blood pressure at goal levels.          Exercise Goals and Review:  Exercise Goals     Row Name 11/02/23 1513             Exercise Goals   Increase Physical Activity Yes       Intervention Provide advice, education, support and counseling about physical activity/exercise needs.;Develop an individualized exercise prescription for aerobic and resistive training based on initial evaluation findings, risk stratification, comorbidities and participant's personal goals.       Expected Outcomes Short Term: Attend rehab on a regular basis to increase amount of physical activity.;Long Term: Add in home exercise to make exercise part of routine and to increase amount of physical activity.;Long Term: Exercising regularly at least 3-5 days a week.       Increase Strength and Stamina Yes       Intervention Provide advice, education, support and counseling about physical activity/exercise needs.;Develop an individualized exercise prescription for aerobic and resistive training based on initial evaluation findings, risk stratification, comorbidities and participant's personal goals.       Expected Outcomes Short Term: Increase workloads from initial exercise prescription for resistance, speed, and METs.;Short Term: Perform resistance training exercises routinely during rehab and add in resistance training at home;Long Term: Improve cardiorespiratory fitness, muscular endurance and strength as measured by increased METs and functional capacity ( )       Able to understand and use rate of perceived exertion (RPE) scale Yes       Intervention Provide education and explanation on how to use RPE scale       Expected Outcomes Short Term: Able to use RPE daily in rehab to express subjective intensity level;Long Term:   Able to use RPE to guide intensity level when exercising independently       Able to understand and use Dyspnea scale Yes       Intervention Provide education and explanation on how to use Dyspnea scale       Expected Outcomes Long Term: Able to use Dyspnea scale to guide intensity level when exercising independently;Short Term: Able to use Dyspnea scale daily in rehab to express subjective sense of shortness of breath during exertion       Knowledge and understanding of Target Heart Rate Range (THRR) Yes       Intervention Provide education and explanation of THRR including how the numbers were predicted and where they are located for reference       Expected Outcomes Short Term: Able to state/look up THRR;Short Term: Able to use daily as guideline for intensity in rehab;Long Term: Able to use THRR to govern intensity when exercising independently       Able to check pulse independently Yes  Intervention Provide education and demonstration on how to check pulse in carotid and radial arteries.;Review the importance of being able to check your own pulse for safety during independent exercise       Expected Outcomes Short Term: Able to explain why pulse checking is important during independent exercise;Long Term: Able to check pulse independently and accurately       Understanding of Exercise Prescription Yes       Intervention Provide education, explanation, and written materials on patient's individual exercise prescription       Expected Outcomes Short Term: Able to explain program exercise prescription;Long Term: Able to explain home exercise prescription to exercise independently          Copy of goals given to participant.

## 2023-11-02 NOTE — Progress Notes (Signed)
 Cardiac Individual Treatment Plan  Patient Details  Name: Angel Douglas MRN: [id number] Date of Birth: July 31, 1960 Referring Provider:   Flowsheet Row Cardiac Rehab from 11/02/2023 in [facility] Cardiac and Pulmonary Rehab  Referring Provider Dr. Cara Lovelace    Initial Encounter Date:  Flowsheet Row Cardiac Rehab from 11/02/2023 in [facility] Cardiac and Pulmonary Rehab  Date 11/02/23    Visit Diagnosis: NSTEMI (non-ST elevated myocardial infarction) [facility])  Patient's Home Medications on Admission:  Current Outpatient Medications:    acetaminophen  (TYLENOL ) 325 MG tablet, Take 2 tablets (650 mg total) by mouth every 6 (six) hours as needed for mild pain (or Fever >/= 101)., Disp: 20 tablet, Rfl: 0   ALLEGRA-D ALLERGY & CONGESTION 180-240 MG 24 hr tablet, Take 1 tablet by mouth daily., Disp: , Rfl:    aspirin  EC 81 MG tablet, Take 1 tablet (81 mg total) by mouth daily. Swallow whole., Disp: 30 tablet, Rfl: 11   buPROPion (WELLBUTRIN XL) 300 MG 24 hr tablet, Take 300 mg by mouth daily., Disp: , Rfl:    clopidogrel  (PLAVIX ) 75 MG tablet, Take 1 tablet (75 mg total) by mouth daily., Disp: 30 tablet, Rfl: 2   estradiol (ESTRACE) 0.1 MG/GM vaginal cream, Place vaginally., Disp: , Rfl:    isosorbide  mononitrate (IMDUR ) 30 MG 24 hr tablet, Take 1 tablet (30 mg total) by mouth daily. (Patient not taking: Reported on 10/27/2023), Disp: 30 tablet, Rfl: 11   metoprolol  succinate (TOPROL -XL) 25 MG 24 hr tablet, Take 0.5 tablets (12.5 mg total) by mouth daily. (Patient not taking: Reported on 10/27/2023), Disp: 15 tablet, Rfl: 11   MOUNJARO 15 MG/0.5ML Pen, , Disp: , Rfl:    Multiple Vitamin (MULTI-VITAMINS) TABS, Take by mouth., Disp: , Rfl:    rosuvastatin  (CRESTOR ) 5 MG tablet, Take 1 tablet (5 mg total) by mouth daily., Disp: 30 tablet, Rfl: 11  Past Medical History: Past Medical History:  Diagnosis Date   Allergy    Depression    Hypertension    Preeclampsia     Tobacco Use: Social History    Tobacco Use  Smoking Status Former   Types: Cigarettes   Start date: 10/27/1998   Quit date: 10/27/1983   Years since quitting: 40.0  Smokeless Tobacco Never  Tobacco Comments   Quit 25ya, 15 pack year    Labs: Review Flowsheet       Latest Ref Rng & Units 10/16/2023  Labs for ITP Cardiac and Pulmonary Rehab  Cholestrol 0 - 200 mg/dL 845   LDL (calc) 0 - 99 mg/dL 92   HDL-C >59 mg/dL 54   Trlycerides <849 mg/dL 42      Exercise Target Goals: Exercise Program Goal: Individual exercise prescription set using results from initial 6 min walk test and THRR while considering  patient's activity barriers and safety.   Exercise Prescription Goal: Initial exercise prescription builds to 30-45 minutes a day of aerobic activity, 2-3 days per week.  Home exercise guidelines will be given to patient during program as part of exercise prescription that the participant will acknowledge.   Education: Aerobic Exercise: - Group verbal and visual presentation on the components of exercise prescription. Introduces F.I.T.T principle from ACSM for exercise prescriptions.  Reviews F.I.T.T. principles of aerobic exercise including progression. Written material given at graduation.   Education: Resistance Exercise: - Group verbal and visual presentation on the components of exercise prescription. Introduces F.I.T.T principle from ACSM for exercise prescriptions  Reviews F.I.T.T. principles of resistance exercise including progression. Written  material given at graduation.    Education: Exercise & Equipment Safety: - Individual verbal instruction and demonstration of equipment use and safety with use of the equipment. Flowsheet Row Cardiac Rehab from 11/02/2023 in [facility] Cardiac and Pulmonary Rehab  Date 11/02/23  Educator [facility]  Instruction Review Code 1- Verbalizes Understanding    Education: Exercise Physiology & General Exercise Guidelines: - Group verbal and written instruction with models to  review the exercise physiology of the cardiovascular system and associated critical values. Provides general exercise guidelines with specific guidelines to those with heart or lung disease.    Education: Flexibility, Balance, Mind/Body Relaxation: - Group verbal and visual presentation with interactive activity on the components of exercise prescription. Introduces F.I.T.T principle from ACSM for exercise prescriptions. Reviews F.I.T.T. principles of flexibility and balance exercise training including progression. Also discusses the mind body connection.  Reviews various relaxation techniques to help reduce and manage stress (i.e. Deep breathing, progressive muscle relaxation, and visualization). Balance handout provided to take home. Written material given at graduation.   Activity Barriers & Risk Stratification:  Activity Barriers & Cardiac Risk Stratification - 11/02/23 1510       Activity Barriers & Cardiac Risk Stratification   Activity Barriers None    Cardiac Risk Stratification Moderate          6 Minute Walk:  6 Minute Walk     Row Name 11/02/23 1509         6 Minute Walk   Phase Initial     Distance 1440 feet     Walk Time 6 minutes     # of Rest Breaks 0     MPH 2.73     METS 3.43     RPE 7     Perceived Dyspnea  0     VO2 Peak 12     Symptoms No     Resting HR 64 bpm     Resting BP 106/64     Resting Oxygen Saturation  96 %     Exercise Oxygen Saturation  during 6 min walk 96 %     Max Ex. HR 101 bpm     Max Ex. BP 124/66     2 Minute Post BP 114/60        Oxygen Initial Assessment:   Oxygen Re-Evaluation:   Oxygen Discharge (Final Oxygen Re-Evaluation):   Initial Exercise Prescription:  Initial Exercise Prescription - 11/02/23 1500       Date of Initial Exercise RX and Referring Provider   Date 11/02/23    Referring Provider Dr. Cara Lovelace      Oxygen   Maintain Oxygen Saturation 88% or higher      Treadmill   MPH 2.7    Grade 0     Minutes 15    METs 3.07      NuStep   Level 2    SPM 80    Minutes 15    METs 3.43      REL-XR   Level 2    Watts 25    Speed 50    Minutes 15    METs 3.43      T5 Nustep   Level 2   T6   SPM 80    Minutes 15    METs 3.43      Prescription Details   Duration Progress to 30 minutes of continuous aerobic without signs/symptoms of physical distress      Intensity   THRR 40-80% of  Max Heartrate 101-139    Ratings of Perceived Exertion 11-13    Perceived Dyspnea 0-4      Progression   Progression Continue to progress workloads to maintain intensity without signs/symptoms of physical distress.      Resistance Training   Training Prescription Yes    Weight 7lb    Reps 10-15          Perform Capillary Blood Glucose checks as needed.  Exercise Prescription Changes:   Exercise Prescription Changes     Row Name 11/02/23 1500             Response to Exercise   Blood Pressure (Admit) 106/64       Blood Pressure (Exercise) 124/66       Blood Pressure (Exit) 114/66       Heart Rate (Admit) 64 bpm       Heart Rate (Exercise) 101 bpm       Heart Rate (Exit) 66 bpm       Oxygen Saturation (Admit) 96 %       Oxygen Saturation (Exercise) 96 %       Oxygen Saturation (Exit) 96 %       Rating of Perceived Exertion (Exercise) 7       Perceived Dyspnea (Exercise) 0       Symptoms none       Comments results          Exercise Comments:   Exercise Goals and Review:   Exercise Goals     Row Name 11/02/23 1513             Exercise Goals   Increase Physical Activity Yes       Intervention Provide advice, education, support and counseling about physical activity/exercise needs.;Develop an individualized exercise prescription for aerobic and resistive training based on initial evaluation findings, risk stratification, comorbidities and participant's personal goals.       Expected Outcomes Short Term: Attend rehab on a regular basis to increase amount of  physical activity.;Long Term: Add in home exercise to make exercise part of routine and to increase amount of physical activity.;Long Term: Exercising regularly at least 3-5 days a week.       Increase Strength and Stamina Yes       Intervention Provide advice, education, support and counseling about physical activity/exercise needs.;Develop an individualized exercise prescription for aerobic and resistive training based on initial evaluation findings, risk stratification, comorbidities and participant's personal goals.       Expected Outcomes Short Term: Increase workloads from initial exercise prescription for resistance, speed, and METs.;Short Term: Perform resistance training exercises routinely during rehab and add in resistance training at home;Long Term: Improve cardiorespiratory fitness, muscular endurance and strength as measured by increased METs and functional capacity ( )       Able to understand and use rate of perceived exertion (RPE) scale Yes       Intervention Provide education and explanation on how to use RPE scale       Expected Outcomes Short Term: Able to use RPE daily in rehab to express subjective intensity level;Long Term:  Able to use RPE to guide intensity level when exercising independently       Able to understand and use Dyspnea scale Yes       Intervention Provide education and explanation on how to use Dyspnea scale       Expected Outcomes Long Term: Able to use Dyspnea scale to guide intensity level when exercising  independently;Short Term: Able to use Dyspnea scale daily in rehab to express subjective sense of shortness of breath during exertion       Knowledge and understanding of Target Heart Rate Range (THRR) Yes       Intervention Provide education and explanation of THRR including how the numbers were predicted and where they are located for reference       Expected Outcomes Short Term: Able to state/look up THRR;Short Term: Able to use daily as guideline for  intensity in rehab;Long Term: Able to use THRR to govern intensity when exercising independently       Able to check pulse independently Yes       Intervention Provide education and demonstration on how to check pulse in carotid and radial arteries.;Review the importance of being able to check your own pulse for safety during independent exercise       Expected Outcomes Short Term: Able to explain why pulse checking is important during independent exercise;Long Term: Able to check pulse independently and accurately       Understanding of Exercise Prescription Yes       Intervention Provide education, explanation, and written materials on patient's individual exercise prescription       Expected Outcomes Short Term: Able to explain program exercise prescription;Long Term: Able to explain home exercise prescription to exercise independently          Exercise Goals Re-Evaluation :   Discharge Exercise Prescription (Final Exercise Prescription Changes):  Exercise Prescription Changes - 11/02/23 1500       Response to Exercise   Blood Pressure (Admit) 106/64    Blood Pressure (Exercise) 124/66    Blood Pressure (Exit) 114/66    Heart Rate (Admit) 64 bpm    Heart Rate (Exercise) 101 bpm    Heart Rate (Exit) 66 bpm    Oxygen Saturation (Admit) 96 %    Oxygen Saturation (Exercise) 96 %    Oxygen Saturation (Exit) 96 %    Rating of Perceived Exertion (Exercise) 7    Perceived Dyspnea (Exercise) 0    Symptoms none    Comments results          Nutrition:  Target Goals: Understanding of nutrition guidelines, daily intake of sodium 1500mg , cholesterol 200mg , calories 30% from fat and 7% or less from saturated fats, daily to have 5 or more servings of fruits and vegetables.  Education: All About Nutrition: -Group instruction provided by verbal, written material, interactive activities, discussions, models, and posters to present general guidelines for heart healthy nutrition  including fat, fiber, MyPlate, the role of sodium in heart healthy nutrition, utilization of the nutrition label, and utilization of this knowledge for meal planning. Follow up email sent as well. Written material given at graduation.   Biometrics:  Pre Biometrics - 11/02/23 1514       Pre Biometrics   Height 5' (1.524 m)    Weight 137 lb (62.1 kg)    Waist Circumference 30 inches    Hip Circumference 39 inches    Waist to Hip Ratio 0.77 %    BMI (Calculated) 26.76    Single Leg Stand 22 seconds           Nutrition Therapy Plan and Nutrition Goals:   Nutrition Assessments:  MEDIFICTS Score Key: >=70 Need to make dietary changes  40-70 Heart Healthy Diet <= 40 Therapeutic Level Cholesterol Diet   Picture Your Plate Scores: <59 Unhealthy dietary pattern with much room for improvement. 41-50 Dietary  pattern unlikely to meet recommendations for good health and room for improvement. 51-60 More healthful dietary pattern, with some room for improvement.  >60 Healthy dietary pattern, although there may be some specific behaviors that could be improved.    Nutrition Goals Re-Evaluation:   Nutrition Goals Discharge (Final Nutrition Goals Re-Evaluation):   Psychosocial: Target Goals: Acknowledge presence or absence of significant depression and/or stress, maximize coping skills, provide positive support system. Participant is able to verbalize types and ability to use techniques and skills needed for reducing stress and depression.   Education: Stress, Anxiety, and Depression - Group verbal and visual presentation to define topics covered.  Reviews how body is impacted by stress, anxiety, and depression.  Also discusses healthy ways to reduce stress and to treat/manage anxiety and depression.  Written material given at graduation.   Education: Sleep Hygiene -Provides group verbal and written instruction about how sleep can affect your health.  Define sleep hygiene, discuss  sleep cycles and impact of sleep habits. Review good sleep hygiene tips.    Initial Review & Psychosocial Screening:  Initial Psych Review & Screening - 10/27/23 1430       Initial Review   Current issues with Current Depression;History of Depression      Family Dynamics   Good Support System? Yes    Comments Her depression started with family seperation and the climate that we live in right now. She can look to her daughter, friend and therapis for support.      Barriers   Psychosocial barriers to participate in program The patient should benefit from training in stress management and relaxation.;There are no identifiable barriers or psychosocial needs.      Screening Interventions   Interventions Provide feedback about the scores to participant;To provide support and resources with identified psychosocial needs;Encouraged to exercise    Expected Outcomes Short Term goal: Utilizing psychosocial counselor, staff and physician to assist with identification of specific Stressors or current issues interfering with healing process. Setting desired goal for each stressor or current issue identified.;Long Term Goal: Stressors or current issues are controlled or eliminated.;Short Term goal: Identification and review with participant of any Quality of Life or Depression concerns found by scoring the questionnaire.;Long Term goal: The participant improves quality of Life and PHQ9 Scores as seen by post scores and/or verbalization of changes          Quality of Life Scores:   Scores of 19 and below usually indicate a poorer quality of life in these areas.  A difference of  2-3 points is a clinically meaningful difference.  A difference of 2-3 points in the total score of the Quality of Life Index has been associated with significant improvement in overall quality of life, self-image, physical symptoms, and general health in studies assessing change in quality of life.  PHQ-9: Review Flowsheet        11/02/2023  Depression screen PHQ 2/9  Decreased Interest 0  Down, Depressed, Hopeless 0  PHQ - 2 Score 0  Altered sleeping 0  Tired, decreased energy 1  Change in appetite 0  Feeling bad or failure about yourself  0  Trouble concentrating 0  Moving slowly or fidgety/restless 0  Suicidal thoughts 0  PHQ-9 Score 1  Difficult doing work/chores Not difficult at all   Interpretation of Total Score  Total Score Depression Severity:  1-4 = Minimal depression, 5-9 = Mild depression, 10-14 = Moderate depression, 15-19 = Moderately severe depression, 20-27 = Severe depression  Psychosocial Evaluation and Intervention:  Psychosocial Evaluation - 10/27/23 1431       Psychosocial Evaluation & Interventions   Interventions Encouraged to exercise with the program and follow exercise prescription;Relaxation education;Stress management education    Comments Her depression started with family seperation and the climate that we live in right now. She can look to her daughter, friend and therapis for support.    Expected Outcomes Short: Start HeartTrack to help with mood. Long: Maintain a healthy mental state    Continue Psychosocial Services  Follow up required by staff          Psychosocial Re-Evaluation:   Psychosocial Discharge (Final Psychosocial Re-Evaluation):   Vocational Rehabilitation: Provide vocational rehab assistance to qualifying candidates.   Vocational Rehab Evaluation & Intervention:   Education: Education Goals: Education classes will be provided on a variety of topics geared toward better understanding of heart health and risk factor modification. Participant will state understanding/return demonstration of topics presented as noted by education test scores.  Learning Barriers/Preferences:  Learning Barriers/Preferences - 10/27/23 1427       Learning Barriers/Preferences   Learning Barriers None    Learning Preferences None          General Cardiac  Education Topics:  AED/CPR: - Group verbal and written instruction with the use of models to demonstrate the basic use of the AED with the basic ABC's of resuscitation.   Anatomy and Cardiac Procedures: - Group verbal and visual presentation and models provide information about basic cardiac anatomy and function. Reviews the testing methods done to diagnose heart disease and the outcomes of the test results. Describes the treatment choices: Medical Management, Angioplasty, or Coronary Bypass Surgery for treating various heart conditions including Myocardial Infarction, Angina, Valve Disease, and Cardiac Arrhythmias.  Written material given at graduation.   Medication Safety: - Group verbal and visual instruction to review commonly prescribed medications for heart and lung disease. Reviews the medication, class of the drug, and side effects. Includes the steps to properly store meds and maintain the prescription regimen.  Written material given at graduation.   Intimacy: - Group verbal instruction through game format to discuss how heart and lung disease can affect sexual intimacy. Written material given at graduation..   Know Your Numbers and Heart Failure: - Group verbal and visual instruction to discuss disease risk factors for cardiac and pulmonary disease and treatment options.  Reviews associated critical values for Overweight/Obesity, Hypertension, Cholesterol, and Diabetes.  Discusses basics of heart failure: signs/symptoms and treatments.  Introduces Heart Failure Zone chart for action plan for heart failure.  Written material given at graduation.   Infection Prevention: - Provides verbal and written material to individual with discussion of infection control including proper hand washing and proper equipment cleaning during exercise session. Flowsheet Row Cardiac Rehab from 11/02/2023 in [facility] Cardiac and Pulmonary Rehab  Date 11/02/23  Educator [facility]  Instruction Review Code 1-  Verbalizes Understanding    Falls Prevention: - Provides verbal and written material to individual with discussion of falls prevention and safety. Flowsheet Row Cardiac Rehab from 11/02/2023 in [facility] Cardiac and Pulmonary Rehab  Date 11/02/23  Educator [facility]  Instruction Review Code 1- Verbalizes Understanding    Other: -Provides group and verbal instruction on various topics (see comments)   Knowledge Questionnaire Score:   Core Components/Risk Factors/Patient Goals at Admission:  Personal Goals and Risk Factors at Admission - 10/27/23 1426       Core Components/Risk Factors/Patient Goals on Admission  Weight Management Yes;Weight Loss    Intervention Weight Management: Develop a combined nutrition and exercise program designed to reach desired caloric intake, while maintaining appropriate intake of nutrient and fiber, sodium and fats, and appropriate energy expenditure required for the weight goal.;Weight Management: Provide education and appropriate resources to help participant work on and attain dietary goals.;Weight Management/Obesity: Establish reasonable short term and long term weight goals.    Expected Outcomes Short Term: Continue to assess and modify interventions until short term weight is achieved;Weight Loss: Understanding of general recommendations for a balanced deficit meal plan, which promotes 1-2 lb weight loss per week and includes a negative energy balance of [phone] kcal/d;Understanding recommendations for meals to include 15-35% energy as protein, 25-35% energy from fat, 35-60% energy from carbohydrates, less than 200mg  of dietary cholesterol, 20-35 gm of total fiber daily;Understanding of distribution of calorie intake throughout the day with the consumption of 4-5 meals/snacks    Hypertension Yes    Intervention Provide education on lifestyle modifcations including regular physical activity/exercise, weight management, moderate sodium restriction and increased  consumption of fresh fruit, vegetables, and low fat dairy, alcohol moderation, and smoking cessation.;Monitor prescription use compliance.    Expected Outcomes Short Term: Continued assessment and intervention until BP is < 140/56mm HG in hypertensive participants. < 130/70mm HG in hypertensive participants with diabetes, heart failure or chronic kidney disease.;Long Term: Maintenance of blood pressure at goal levels.          Education:Diabetes - Individual verbal and written instruction to review signs/symptoms of diabetes, desired ranges of glucose level fasting, after meals and with exercise. Acknowledge that pre and post exercise glucose checks will be done for 3 sessions at entry of program.   Core Components/Risk Factors/Patient Goals Review:    Core Components/Risk Factors/Patient Goals at Discharge (Final Review):    ITP Comments:  ITP Comments     Row Name 10/27/23 1428 11/02/23 1508         ITP Comments Virtual Visit completed. Patient informed on EP and RD appointment and 6 Minute walk test. Patient also informed of patient health questionnaires on My Chart. Patient Verbalizes understanding. Visit diagnosis can be found in [facility] 10/15/2023. Completed and gym orientation for cardiac rehab. Initial ITP created and sent for review to Dr. Oneil Pinal, Medical Director.         Comments: Initial ITP

## 2023-11-08 ENCOUNTER — Encounter: Admitting: *Deleted

## 2023-11-08 DIAGNOSIS — I214 Non-ST elevation (NSTEMI) myocardial infarction: Secondary | ICD-10-CM

## 2023-11-08 NOTE — Progress Notes (Signed)
 Daily Session Note  Patient Details  Name: Angel Douglas MRN: [id number] Date of Birth: July 30, 1960 Referring Provider:   Flowsheet Row Cardiac Rehab from 11/02/2023 in [facility] Cardiac and Pulmonary Rehab  Referring Provider Dr. Cara Lovelace    Encounter Date: 11/08/2023  Check In:  Session Check In - 11/08/23 1717       Check-In   Supervising physician immediately available to respond to emergencies See telemetry face sheet for immediately available ER MD    Location [facility]-Cardiac & Pulmonary Rehab    Staff Present Hoy Rodney RN,BSN;Joseph Rolinda RCP,RRT,BSRT;Kelly Bollinger [facility]    Virtual Visit No    Medication changes reported     No    Fall or balance concerns reported    No    Warm-up and Cool-down Performed on first and last piece of equipment    Resistance Training Performed Yes    VAD Patient? No    PAD/SET Patient? No      Pain Assessment   Currently in Pain? No/denies             Social History   Tobacco Use  Smoking Status Former   Types: Cigarettes   Start date: 10/27/1998   Quit date: 10/27/1983   Years since quitting: 40.0  Smokeless Tobacco Never  Tobacco Comments   Quit 25ya, 15 pack year    Goals Met:  Independence with exercise equipment Exercise tolerated well No report of concerns or symptoms today Strength training completed today  Goals Unmet:  Not Applicable  Comments: First full day of exercise!  Patient was oriented to gym and equipment including functions, settings, policies, and procedures.  Patient's individual exercise prescription and treatment plan were reviewed.  All starting workloads were established based on the results of the 6 minute walk test done at initial orientation visit.  The plan for exercise progression was also introduced and progression will be customized based on patient's performance and goals.    Dr. Oneil Pinal is Medical Director for [facility] Cardiac Rehabilitation.  Dr. Fuad Aleskerov is  Medical Director for [facility] Pulmonary Rehabilitation.

## 2023-11-10 ENCOUNTER — Encounter

## 2023-11-10 DIAGNOSIS — I214 Non-ST elevation (NSTEMI) myocardial infarction: Secondary | ICD-10-CM

## 2023-11-10 NOTE — Progress Notes (Signed)
 Daily Session Note  Patient Details  Name: Angel Douglas MRN: [id number] Date of Birth: October 05, 1960 Referring Provider:   Flowsheet Row Cardiac Rehab from 11/02/2023 in [facility] Cardiac and Pulmonary Rehab  Referring Provider Dr. Cara Lovelace    Encounter Date: 11/10/2023  Check In:  Session Check In - 11/10/23 1718       Check-In   Supervising physician immediately available to respond to emergencies See telemetry face sheet for immediately available ER MD    Location [facility]-Cardiac & Pulmonary Rehab    Staff Present Burnard Davenport RN,BSN,MPA;Joseph [facility] Dyane BS, ACSM CEP, Exercise Physiologist    Virtual Visit No    Medication changes reported     No    Fall or balance concerns reported    No    Tobacco Cessation No Change    Warm-up and Cool-down Performed on first and last piece of equipment    Resistance Training Performed Yes    VAD Patient? No    PAD/SET Patient? No      Pain Assessment   Currently in Pain? No/denies             Social History   Tobacco Use  Smoking Status Former   Types: Cigarettes   Start date: 10/27/1998   Quit date: 10/27/1983   Years since quitting: 40.0  Smokeless Tobacco Never  Tobacco Comments   Quit 25ya, 15 pack year    Goals Met:  Independence with exercise equipment Exercise tolerated well No report of concerns or symptoms today Strength training completed today  Goals Unmet:  Not Applicable  Comments: Pt able to follow exercise prescription today without complaint.  Will continue to monitor for progression.    Dr. Oneil Pinal is Medical Director for [facility] Cardiac Rehabilitation.  Dr. Fuad Aleskerov is Medical Director for [facility] Pulmonary Rehabilitation.

## 2023-11-11 ENCOUNTER — Encounter: Admitting: *Deleted

## 2023-11-11 DIAGNOSIS — I214 Non-ST elevation (NSTEMI) myocardial infarction: Secondary | ICD-10-CM | POA: Diagnosis not present

## 2023-11-11 NOTE — Progress Notes (Signed)
 Daily Session Note  Patient Details  Name: Angel Douglas MRN: [id number] Date of Birth: [birth year] Referring Provider:   Flowsheet Row Cardiac Rehab from 11/02/2023 in [facility] Cardiac and Pulmonary Rehab  Referring Provider Dr. Cara Lovelace    Encounter Date: 11/11/2023  Check In:  Session Check In - 11/11/23 1717       Check-In   Supervising physician immediately available to respond to emergencies See telemetry face sheet for immediately available ER MD    Location [facility]-Cardiac & Pulmonary Rehab    Staff Present Hoy Rodney RN,BSN;Joseph [facility] BS, Exercise Physiologist;Kristen Coble RN,BC,MSN    Virtual Visit No    Medication changes reported     No    Fall or balance concerns reported    No    Warm-up and Cool-down Performed on first and last piece of equipment    Resistance Training Performed Yes    VAD Patient? No    PAD/SET Patient? No      Pain Assessment   Currently in Pain? No/denies             Social History   Tobacco Use  Smoking Status Former   Types: Cigarettes   Start date: 10/27/1998   Quit date: 10/27/1983   Years since quitting: 40.0  Smokeless Tobacco Never  Tobacco Comments   Quit 25ya, 15 pack year    Goals Met:  Independence with exercise equipment Exercise tolerated well No report of concerns or symptoms today Strength training completed today  Goals Unmet:  Not Applicable  Comments: Pt able to follow exercise prescription today without complaint.  Will continue to monitor for progression.    Dr. Oneil Pinal is Medical Director for [facility] Cardiac Rehabilitation.  Dr. Fuad Aleskerov is Medical Director for [facility] Pulmonary Rehabilitation.

## 2023-11-15 ENCOUNTER — Encounter: Admitting: *Deleted

## 2023-11-15 DIAGNOSIS — I214 Non-ST elevation (NSTEMI) myocardial infarction: Secondary | ICD-10-CM | POA: Diagnosis not present

## 2023-11-15 NOTE — Progress Notes (Signed)
 Daily Session Note  Patient Details  Name: Angel Douglas MRN: [id number] Date of Birth: 1960/11/03 Referring Provider:   Flowsheet Row Cardiac Rehab from 11/02/2023 in [facility] Cardiac and Pulmonary Rehab  Referring Provider Dr. Cara Lovelace    Encounter Date: 11/15/2023  Check In:  Session Check In - 11/15/23 1729       Check-In   Supervising physician immediately available to respond to emergencies See telemetry face sheet for immediately available ER MD    Location [facility]-Cardiac & Pulmonary Rehab    Staff Present Hoy Rodney RN,BSN;Joseph [facility] Dyane BS, ACSM CEP, Exercise Physiologist    Virtual Visit No    Medication changes reported     No    Fall or balance concerns reported    No    Warm-up and Cool-down Performed on first and last piece of equipment    Resistance Training Performed Yes    VAD Patient? No    PAD/SET Patient? No      Pain Assessment   Currently in Pain? No/denies             Social History   Tobacco Use  Smoking Status Former   Types: Cigarettes   Start date: 10/27/1998   Quit date: 10/27/1983   Years since quitting: 40.0  Smokeless Tobacco Never  Tobacco Comments   Quit 25ya, 15 pack year    Goals Met:  Independence with exercise equipment Exercise tolerated well No report of concerns or symptoms today Strength training completed today  Goals Unmet:  Not Applicable  Comments: Pt able to follow exercise prescription today without complaint.  Will continue to monitor for progression.    Dr. Oneil Pinal is Medical Director for [facility] Cardiac Rehabilitation.  Dr. Fuad Aleskerov is Medical Director for [facility] Pulmonary Rehabilitation.

## 2023-11-17 ENCOUNTER — Encounter

## 2023-11-17 DIAGNOSIS — I214 Non-ST elevation (NSTEMI) myocardial infarction: Secondary | ICD-10-CM | POA: Diagnosis not present

## 2023-11-17 NOTE — Progress Notes (Signed)
 Daily Session Note  Patient Details  Name: Angel Douglas MRN: [id number] Date of Birth: 1961-03-06 Referring Provider:   Flowsheet Row Cardiac Rehab from 11/02/2023 in [facility] Cardiac and Pulmonary Rehab  Referring Provider Dr. Cara Lovelace    Encounter Date: 11/17/2023  Check In:  Session Check In - 11/17/23 0759       Check-In   Supervising physician immediately available to respond to emergencies See telemetry face sheet for immediately available ER MD    Location [facility]-Cardiac & Pulmonary Rehab    Staff Present Burnard Davenport RN,BSN,MPA;Joseph [facility] RCP,RRT,BSRT;Margaret Best, MS, Exercise Physiologist;Jason Elnor RDN,LDN;Noah Tickle, BS, Exercise Physiologist    Virtual Visit No    Medication changes reported     No    Fall or balance concerns reported    No    Tobacco Cessation No Change    Warm-up and Cool-down Performed on first and last piece of equipment    Resistance Training Performed Yes    VAD Patient? No    PAD/SET Patient? No      Pain Assessment   Currently in Pain? No/denies             Social History   Tobacco Use  Smoking Status Former   Types: Cigarettes   Start date: 10/27/1998   Quit date: 10/27/1983   Years since quitting: 40.0  Smokeless Tobacco Never  Tobacco Comments   Quit 25ya, 15 pack year    Goals Met:  Independence with exercise equipment Exercise tolerated well No report of concerns or symptoms today Strength training completed today  Goals Unmet:  Not Applicable  Comments: Pt able to follow exercise prescription today without complaint.  Will continue to monitor for progression.    Dr. Oneil Pinal is Medical Director for [facility] Cardiac Rehabilitation.  Dr. Fuad Aleskerov is Medical Director for [facility] Pulmonary Rehabilitation.

## 2023-11-18 ENCOUNTER — Encounter: Admitting: *Deleted

## 2023-11-18 DIAGNOSIS — I214 Non-ST elevation (NSTEMI) myocardial infarction: Secondary | ICD-10-CM

## 2023-11-18 NOTE — Progress Notes (Signed)
 Daily Session Note  Patient Details  Name: Angel Douglas MRN: [id number] Date of Birth: 1960/09/25 Referring Provider:   Flowsheet Row Cardiac Rehab from 11/02/2023 in [facility] Cardiac and Pulmonary Rehab  Referring Provider Dr. Cara Lovelace    Encounter Date: 11/18/2023  Check In:  Session Check In - 11/18/23 1738       Check-In   Supervising physician immediately available to respond to emergencies See telemetry face sheet for immediately available ER MD    Location [facility]-Cardiac & Pulmonary Rehab    Staff Present Hoy Rodney RN,BSN;Maxon Burnell BS, Exercise Physiologist;Margaret Best, MS, Exercise Physiologist    Virtual Visit No    Medication changes reported     No    Fall or balance concerns reported    No    Warm-up and Cool-down Performed on first and last piece of equipment    Resistance Training Performed Yes    VAD Patient? No    PAD/SET Patient? No      Pain Assessment   Currently in Pain? No/denies             Social History   Tobacco Use  Smoking Status Former   Types: Cigarettes   Start date: 10/27/1998   Quit date: 10/27/1983   Years since quitting: 40.0  Smokeless Tobacco Never  Tobacco Comments   Quit 25ya, 15 pack year    Goals Met:  Independence with exercise equipment Exercise tolerated well No report of concerns or symptoms today Strength training completed today  Goals Unmet:  Not Applicable  Comments: Pt able to follow exercise prescription today without complaint.  Will continue to monitor for progression.    Dr. Oneil Pinal is Medical Director for [facility] Cardiac Rehabilitation.  Dr. Fuad Aleskerov is Medical Director for [facility] Pulmonary Rehabilitation.

## 2023-11-22 ENCOUNTER — Encounter: Admitting: *Deleted

## 2023-11-22 DIAGNOSIS — I214 Non-ST elevation (NSTEMI) myocardial infarction: Secondary | ICD-10-CM | POA: Diagnosis not present

## 2023-11-22 NOTE — Progress Notes (Signed)
 Daily Session Note  Patient Details  Name: Angel Douglas MRN: [id number] Date of Birth: [birth year] Referring Provider:   Flowsheet Row Cardiac Rehab from 11/02/2023 in [facility] Cardiac and Pulmonary Rehab  Referring Provider Dr. Cara Lovelace    Encounter Date: 11/22/2023  Check In:  Session Check In - 11/22/23 1731       Check-In   Supervising physician immediately available to respond to emergencies See telemetry face sheet for immediately available ER MD    Location [facility]-Cardiac & Pulmonary Rehab    Staff Present Hoy Rodney RN,BSN;Kelly Dyane BS, ACSM CEP, Exercise Physiologist;Joseph Rolinda RCP,RRT,BSRT    Virtual Visit No    Medication changes reported     No    Fall or balance concerns reported    No    Warm-up and Cool-down Performed on first and last piece of equipment    Resistance Training Performed Yes    VAD Patient? No    PAD/SET Patient? No      Pain Assessment   Currently in Pain? No/denies             Social History   Tobacco Use  Smoking Status Former   Types: Cigarettes   Start date: 10/27/1998   Quit date: 10/27/1983   Years since quitting: 40.0  Smokeless Tobacco Never  Tobacco Comments   Quit 25ya, 15 pack year    Goals Met:  Independence with exercise equipment Exercise tolerated well No report of concerns or symptoms today Strength training completed today  Goals Unmet:  Not Applicable  Comments: Pt able to follow exercise prescription today without complaint.  Will continue to monitor for progression.    Dr. Oneil Pinal is Medical Director for [facility] Cardiac Rehabilitation.  Dr. Fuad Aleskerov is Medical Director for [facility] Pulmonary Rehabilitation.

## 2023-11-24 ENCOUNTER — Encounter

## 2023-11-24 DIAGNOSIS — I214 Non-ST elevation (NSTEMI) myocardial infarction: Secondary | ICD-10-CM | POA: Diagnosis not present

## 2023-11-24 NOTE — Progress Notes (Signed)
 Daily Session Note  Patient Details  Name: Angel Douglas MRN: [id number] Date of Birth: November 12, 1960 Referring Provider:   Flowsheet Row Cardiac Rehab from 11/02/2023 in [facility] Cardiac and Pulmonary Rehab  Referring Provider Dr. Cara Lovelace    Encounter Date: 11/24/2023  Check In:  Session Check In - 11/24/23 1059       Check-In   Supervising physician immediately available to respond to emergencies See telemetry face sheet for immediately available ER MD    Location [facility]-Cardiac & Pulmonary Rehab    Staff Present Burnard Davenport RN,BSN,MPA;Joseph [facility] RCP,RRT,BSRT;Margaret Best, MS, Exercise Physiologist;Jason Elnor RDN,LDN;Noah Tickle, BS, Exercise Physiologist    Virtual Visit No    Medication changes reported     No    Fall or balance concerns reported    No    Tobacco Cessation No Change    Warm-up and Cool-down Performed on first and last piece of equipment    Resistance Training Performed Yes    VAD Patient? No    PAD/SET Patient? No      Pain Assessment   Currently in Pain? No/denies             Social History   Tobacco Use  Smoking Status Former   Types: Cigarettes   Start date: 10/27/1998   Quit date: 10/27/1983   Years since quitting: 40.1  Smokeless Tobacco Never  Tobacco Comments   Quit 25ya, 15 pack year    Goals Met:  Independence with exercise equipment Exercise tolerated well No report of concerns or symptoms today Strength training completed today  Goals Unmet:  Not Applicable  Comments: Pt able to follow exercise prescription today without complaint.  Will continue to monitor for progression.    Dr. Oneil Pinal is Medical Director for [facility] Cardiac Rehabilitation.  Dr. Fuad Aleskerov is Medical Director for [facility] Pulmonary Rehabilitation.

## 2023-11-24 NOTE — Progress Notes (Signed)
 Cardiac Individual Treatment Plan  Patient Details  Name: Angel Douglas MRN: [id number] Date of Birth: Jun 13, 1960 Referring Provider:   Flowsheet Row Cardiac Rehab from 11/02/2023 in [facility] Cardiac and Pulmonary Rehab  Referring Provider Dr. Cara Lovelace    Initial Encounter Date:  Flowsheet Row Cardiac Rehab from 11/02/2023 in [facility] Cardiac and Pulmonary Rehab  Date 11/02/23    Visit Diagnosis: NSTEMI (non-ST elevated myocardial infarction) [facility])  Patient's Home Medications on Admission:  Current Outpatient Medications:    acetaminophen  (TYLENOL ) 325 MG tablet, Take 2 tablets (650 mg total) by mouth every 6 (six) hours as needed for mild pain (or Fever >/= 101)., Disp: 20 tablet, Rfl: 0   ALLEGRA-D ALLERGY & CONGESTION 180-240 MG 24 hr tablet, Take 1 tablet by mouth daily., Disp: , Rfl:    aspirin  EC 81 MG tablet, Take 1 tablet (81 mg total) by mouth daily. Swallow whole., Disp: 30 tablet, Rfl: 11   buPROPion (WELLBUTRIN XL) 300 MG 24 hr tablet, Take 300 mg by mouth daily., Disp: , Rfl:    clopidogrel  (PLAVIX ) 75 MG tablet, Take 1 tablet (75 mg total) by mouth daily., Disp: 30 tablet, Rfl: 2   estradiol (ESTRACE) 0.1 MG/GM vaginal cream, Place vaginally., Disp: , Rfl:    isosorbide  mononitrate (IMDUR ) 30 MG 24 hr tablet, Take 1 tablet (30 mg total) by mouth daily. (Patient not taking: Reported on 10/27/2023), Disp: 30 tablet, Rfl: 11   metoprolol  succinate (TOPROL -XL) 25 MG 24 hr tablet, Take 0.5 tablets (12.5 mg total) by mouth daily. (Patient not taking: Reported on 10/27/2023), Disp: 15 tablet, Rfl: 11   MOUNJARO 15 MG/0.5ML Pen, , Disp: , Rfl:    Multiple Vitamin (MULTI-VITAMINS) TABS, Take by mouth., Disp: , Rfl:    rosuvastatin  (CRESTOR ) 5 MG tablet, Take 1 tablet (5 mg total) by mouth daily., Disp: 30 tablet, Rfl: 11  Past Medical History: Past Medical History:  Diagnosis Date   Allergy    Depression    Hypertension    Preeclampsia     Tobacco Use: Social History    Tobacco Use  Smoking Status Former   Types: Cigarettes   Start date: 10/27/1998   Quit date: 10/27/1983   Years since quitting: 40.1  Smokeless Tobacco Never  Tobacco Comments   Quit 25ya, 15 pack year    Labs: Review Flowsheet       Latest Ref Rng & Units 10/16/2023  Labs for ITP Cardiac and Pulmonary Rehab  Cholestrol 0 - 200 mg/dL 845   LDL (calc) 0 - 99 mg/dL 92   HDL-C >59 mg/dL 54   Trlycerides <849 mg/dL 42      Exercise Target Goals: Exercise Program Goal: Individual exercise prescription set using results from initial 6 min walk test and THRR while considering  patient's activity barriers and safety.   Exercise Prescription Goal: Initial exercise prescription builds to 30-45 minutes a day of aerobic activity, 2-3 days per week.  Home exercise guidelines will be given to patient during program as part of exercise prescription that the participant will acknowledge.   Education: Aerobic Exercise: - Group verbal and visual presentation on the components of exercise prescription. Introduces F.I.T.T principle from ACSM for exercise prescriptions.  Reviews F.I.T.T. principles of aerobic exercise including progression. Written material given at graduation.   Education: Resistance Exercise: - Group verbal and visual presentation on the components of exercise prescription. Introduces F.I.T.T principle from ACSM for exercise prescriptions  Reviews F.I.T.T. principles of resistance exercise including progression. Written  material given at graduation.    Education: Exercise & Equipment Safety: - Individual verbal instruction and demonstration of equipment use and safety with use of the equipment. Flowsheet Row Cardiac Rehab from 11/02/2023 in [facility] Cardiac and Pulmonary Rehab  Date 11/02/23  Educator [facility]  Instruction Review Code 1- Verbalizes Understanding    Education: Exercise Physiology & General Exercise Guidelines: - Group verbal and written instruction with models to  review the exercise physiology of the cardiovascular system and associated critical values. Provides general exercise guidelines with specific guidelines to those with heart or lung disease.    Education: Flexibility, Balance, Mind/Body Relaxation: - Group verbal and visual presentation with interactive activity on the components of exercise prescription. Introduces F.I.T.T principle from ACSM for exercise prescriptions. Reviews F.I.T.T. principles of flexibility and balance exercise training including progression. Also discusses the mind body connection.  Reviews various relaxation techniques to help reduce and manage stress (i.e. Deep breathing, progressive muscle relaxation, and visualization). Balance handout provided to take home. Written material given at graduation.   Activity Barriers & Risk Stratification:  Activity Barriers & Cardiac Risk Stratification - 11/02/23 1510       Activity Barriers & Cardiac Risk Stratification   Activity Barriers None    Cardiac Risk Stratification Moderate          6 Minute Walk:  6 Minute Walk     Row Name 11/02/23 1509         6 Minute Walk   Phase Initial     Distance 1440 feet     Walk Time 6 minutes     # of Rest Breaks 0     MPH 2.73     METS 3.43     RPE 7     Perceived Dyspnea  0     VO2 Peak 12     Symptoms No     Resting HR 64 bpm     Resting BP 106/64     Resting Oxygen Saturation  96 %     Exercise Oxygen Saturation  during 6 min walk 96 %     Max Ex. HR 101 bpm     Max Ex. BP 124/66     2 Minute Post BP 114/60        Oxygen Initial Assessment:   Oxygen Re-Evaluation:   Oxygen Discharge (Final Oxygen Re-Evaluation):   Initial Exercise Prescription:  Initial Exercise Prescription - 11/02/23 1500       Date of Initial Exercise RX and Referring Provider   Date 11/02/23    Referring Provider Dr. Cara Lovelace      Oxygen   Maintain Oxygen Saturation 88% or higher      Treadmill   MPH 2.7    Grade 0     Minutes 15    METs 3.07      NuStep   Level 2    SPM 80    Minutes 15    METs 3.43      REL-XR   Level 2    Watts 25    Speed 50    Minutes 15    METs 3.43      T5 Nustep   Level 2   T6   SPM 80    Minutes 15    METs 3.43      Prescription Details   Duration Progress to 30 minutes of continuous aerobic without signs/symptoms of physical distress      Intensity   THRR 40-80% of  Max Heartrate 101-139    Ratings of Perceived Exertion 11-13    Perceived Dyspnea 0-4      Progression   Progression Continue to progress workloads to maintain intensity without signs/symptoms of physical distress.      Resistance Training   Training Prescription Yes    Weight 7lb    Reps 10-15          Perform Capillary Blood Glucose checks as needed.  Exercise Prescription Changes:   Exercise Prescription Changes     Row Name 11/02/23 1500 11/19/23 1000           Response to Exercise   Blood Pressure (Admit) 106/64 108/72      Blood Pressure (Exercise) 124/66 140/58      Blood Pressure (Exit) 114/66 96/70      Heart Rate (Admit) 64 bpm 72 bpm      Heart Rate (Exercise) 101 bpm 122 bpm      Heart Rate (Exit) 66 bpm 90 bpm      Oxygen Saturation (Admit) 96 % --      Oxygen Saturation (Exercise) 96 % --      Oxygen Saturation (Exit) 96 % --      Rating of Perceived Exertion (Exercise) 7 15      Perceived Dyspnea (Exercise) 0 0      Symptoms none none      Comments results first 2 weeks of exercise      Duration -- Progress to 30 minutes of  aerobic without signs/symptoms of physical distress      Intensity -- THRR unchanged        Progression   Progression -- Continue to progress workloads to maintain intensity without signs/symptoms of physical distress.      Average METs -- 5.2        Resistance Training   Training Prescription -- Yes      Weight -- 7lb      Reps -- 10-15        Interval Training   Interval Training -- No        Treadmill   MPH -- 3.3       Grade -- 5      Minutes -- 15      METs -- 5.8        NuStep   Level -- 2      Minutes -- 15      METs -- 3.1        REL-XR   Level -- 5      Minutes -- 15      METs -- 6.8        Oxygen   Maintain Oxygen Saturation -- 88% or higher         Exercise Comments:   Exercise Comments     Row Name 11/08/23 1719           Exercise Comments First full day of exercise!  Patient was oriented to gym and equipment including functions, settings, policies, and procedures.  Patient's individual exercise prescription and treatment plan were reviewed.  All starting workloads were established based on the results of the 6 minute walk test done at initial orientation visit.  The plan for exercise progression was also introduced and progression will be customized based on patient's performance and goals.          Exercise Goals and Review:   Exercise Goals     Row Name 11/02/23 [phone]  Exercise Goals   Increase Physical Activity Yes       Intervention Provide advice, education, support and counseling about physical activity/exercise needs.;Develop an individualized exercise prescription for aerobic and resistive training based on initial evaluation findings, risk stratification, comorbidities and participant's personal goals.       Expected Outcomes Short Term: Attend rehab on a regular basis to increase amount of physical activity.;Long Term: Add in home exercise to make exercise part of routine and to increase amount of physical activity.;Long Term: Exercising regularly at least 3-5 days a week.       Increase Strength and Stamina Yes       Intervention Provide advice, education, support and counseling about physical activity/exercise needs.;Develop an individualized exercise prescription for aerobic and resistive training based on initial evaluation findings, risk stratification, comorbidities and participant's personal goals.       Expected Outcomes Short Term: Increase  workloads from initial exercise prescription for resistance, speed, and METs.;Short Term: Perform resistance training exercises routinely during rehab and add in resistance training at home;Long Term: Improve cardiorespiratory fitness, muscular endurance and strength as measured by increased METs and functional capacity ( )       Able to understand and use rate of perceived exertion (RPE) scale Yes       Intervention Provide education and explanation on how to use RPE scale       Expected Outcomes Short Term: Able to use RPE daily in rehab to express subjective intensity level;Long Term:  Able to use RPE to guide intensity level when exercising independently       Able to understand and use Dyspnea scale Yes       Intervention Provide education and explanation on how to use Dyspnea scale       Expected Outcomes Long Term: Able to use Dyspnea scale to guide intensity level when exercising independently;Short Term: Able to use Dyspnea scale daily in rehab to express subjective sense of shortness of breath during exertion       Knowledge and understanding of Target Heart Rate Range (THRR) Yes       Intervention Provide education and explanation of THRR including how the numbers were predicted and where they are located for reference       Expected Outcomes Short Term: Able to state/look up THRR;Short Term: Able to use daily as guideline for intensity in rehab;Long Term: Able to use THRR to govern intensity when exercising independently       Able to check pulse independently Yes       Intervention Provide education and demonstration on how to check pulse in carotid and radial arteries.;Review the importance of being able to check your own pulse for safety during independent exercise       Expected Outcomes Short Term: Able to explain why pulse checking is important during independent exercise;Long Term: Able to check pulse independently and accurately       Understanding of Exercise Prescription Yes        Intervention Provide education, explanation, and written materials on patient's individual exercise prescription       Expected Outcomes Short Term: Able to explain program exercise prescription;Long Term: Able to explain home exercise prescription to exercise independently          Exercise Goals Re-Evaluation :  Exercise Goals Re-Evaluation     Row Name 11/08/23 1719 11/19/23 1029           Exercise Goal Re-Evaluation   Exercise Goals Review Increase Physical Activity;Knowledge  and understanding of Target Heart Rate Range (THRR);Able to understand and use rate of perceived exertion (RPE) scale;Understanding of Exercise Prescription;Increase Strength and Stamina;Able to check pulse independently Increase Physical Activity;Increase Strength and Stamina;Understanding of Exercise Prescription      Comments Reviewed RPE and dyspnea scale, THR and program prescription with pt today.  Pt voiced understanding and was given a copy of goals to take home. Angel Douglas is off to a great start in rehab. She was able to attend her first 3 sessions during this weeks review period. During these sessions she was able to increase from 2.7 to 3. on the treadmill, and increase to level 5 on the XR. We will continue to monitor her progress in the program.      Expected Outcomes Short: Use RPE daily to regulate intensity.  Long: Follow program prescription in THR. Short: Continue to follow exercise prescription. Long: Continue exercise to improve strength and stamina.         Discharge Exercise Prescription (Final Exercise Prescription Changes):  Exercise Prescription Changes - 11/19/23 1000       Response to Exercise   Blood Pressure (Admit) 108/72    Blood Pressure (Exercise) 140/58    Blood Pressure (Exit) 96/70    Heart Rate (Admit) 72 bpm    Heart Rate (Exercise) 122 bpm    Heart Rate (Exit) 90 bpm    Rating of Perceived Exertion (Exercise) 15    Perceived Dyspnea (Exercise) 0    Symptoms none     Comments first 2 weeks of exercise    Duration Progress to 30 minutes of  aerobic without signs/symptoms of physical distress    Intensity THRR unchanged      Progression   Progression Continue to progress workloads to maintain intensity without signs/symptoms of physical distress.    Average METs 5.2      Resistance Training   Training Prescription Yes    Weight 7lb    Reps 10-15      Interval Training   Interval Training No      Treadmill   MPH 3.3    Grade 5    Minutes 15    METs 5.8      NuStep   Level 2    Minutes 15    METs 3.1      REL-XR   Level 5    Minutes 15    METs 6.8      Oxygen   Maintain Oxygen Saturation 88% or higher          Nutrition:  Target Goals: Understanding of nutrition guidelines, daily intake of sodium 1500mg , cholesterol 200mg , calories 30% from fat and 7% or less from saturated fats, daily to have 5 or more servings of fruits and vegetables.  Education: All About Nutrition: -Group instruction provided by verbal, written material, interactive activities, discussions, models, and posters to present general guidelines for heart healthy nutrition including fat, fiber, MyPlate, the role of sodium in heart healthy nutrition, utilization of the nutrition label, and utilization of this knowledge for meal planning. Follow up email sent as well. Written material given at graduation.   Biometrics:  Pre Biometrics - 11/02/23 1514       Pre Biometrics   Height 5' (1.524 m)    Weight 137 lb (62.1 kg)    Waist Circumference 30 inches    Hip Circumference 39 inches    Waist to Hip Ratio 0.77 %    BMI (Calculated) 26.76    Single  Leg Stand 22 seconds           Nutrition Therapy Plan and Nutrition Goals:   Nutrition Assessments:  MEDIFICTS Score Key: >=70 Need to make dietary changes  40-70 Heart Healthy Diet <= 40 Therapeutic Level Cholesterol Diet   Picture Your Plate Scores: <59 Unhealthy dietary pattern with much room for  improvement. 41-50 Dietary pattern unlikely to meet recommendations for good health and room for improvement. 51-60 More healthful dietary pattern, with some room for improvement.  >60 Healthy dietary pattern, although there may be some specific behaviors that could be improved.    Nutrition Goals Re-Evaluation:   Nutrition Goals Discharge (Final Nutrition Goals Re-Evaluation):   Psychosocial: Target Goals: Acknowledge presence or absence of significant depression and/or stress, maximize coping skills, provide positive support system. Participant is able to verbalize types and ability to use techniques and skills needed for reducing stress and depression.   Education: Stress, Anxiety, and Depression - Group verbal and visual presentation to define topics covered.  Reviews how body is impacted by stress, anxiety, and depression.  Also discusses healthy ways to reduce stress and to treat/manage anxiety and depression.  Written material given at graduation.   Education: Sleep Hygiene -Provides group verbal and written instruction about how sleep can affect your health.  Define sleep hygiene, discuss sleep cycles and impact of sleep habits. Review good sleep hygiene tips.    Initial Review & Psychosocial Screening:  Initial Psych Review & Screening - 10/27/23 1430       Initial Review   Current issues with Current Depression;History of Depression      Family Dynamics   Good Support System? Yes    Comments Her depression started with family seperation and the climate that we live in right now. She can look to her daughter, friend and therapis for support.      Barriers   Psychosocial barriers to participate in program The patient should benefit from training in stress management and relaxation.;There are no identifiable barriers or psychosocial needs.      Screening Interventions   Interventions Provide feedback about the scores to participant;To provide support and resources with  identified psychosocial needs;Encouraged to exercise    Expected Outcomes Short Term goal: Utilizing psychosocial counselor, staff and physician to assist with identification of specific Stressors or current issues interfering with healing process. Setting desired goal for each stressor or current issue identified.;Long Term Goal: Stressors or current issues are controlled or eliminated.;Short Term goal: Identification and review with participant of any Quality of Life or Depression concerns found by scoring the questionnaire.;Long Term goal: The participant improves quality of Life and PHQ9 Scores as seen by post scores and/or verbalization of changes          Quality of Life Scores:   Scores of 19 and below usually indicate a poorer quality of life in these areas.  A difference of  2-3 points is a clinically meaningful difference.  A difference of 2-3 points in the total score of the Quality of Life Index has been associated with significant improvement in overall quality of life, self-image, physical symptoms, and general health in studies assessing change in quality of life.  PHQ-9: Review Flowsheet       11/02/2023  Depression screen PHQ 2/9  Decreased Interest 0  Down, Depressed, Hopeless 0  PHQ - 2 Score 0  Altered sleeping 0  Tired, decreased energy 1  Change in appetite 0  Feeling bad or failure about yourself  0  Trouble concentrating 0  Moving slowly or fidgety/restless 0  Suicidal thoughts 0  PHQ-9 Score 1  Difficult doing work/chores Not difficult at all   Interpretation of Total Score  Total Score Depression Severity:  1-4 = Minimal depression, 5-9 = Mild depression, 10-14 = Moderate depression, 15-19 = Moderately severe depression, 20-27 = Severe depression   Psychosocial Evaluation and Intervention:  Psychosocial Evaluation - 10/27/23 1431       Psychosocial Evaluation & Interventions   Interventions Encouraged to exercise with the program and follow exercise  prescription;Relaxation education;Stress management education    Comments Her depression started with family seperation and the climate that we live in right now. She can look to her daughter, friend and therapis for support.    Expected Outcomes Short: Start HeartTrack to help with mood. Long: Maintain a healthy mental state    Continue Psychosocial Services  Follow up required by staff          Psychosocial Re-Evaluation:   Psychosocial Discharge (Final Psychosocial Re-Evaluation):   Vocational Rehabilitation: Provide vocational rehab assistance to qualifying candidates.   Vocational Rehab Evaluation & Intervention:   Education: Education Goals: Education classes will be provided on a variety of topics geared toward better understanding of heart health and risk factor modification. Participant will state understanding/return demonstration of topics presented as noted by education test scores.  Learning Barriers/Preferences:  Learning Barriers/Preferences - 10/27/23 1427       Learning Barriers/Preferences   Learning Barriers None    Learning Preferences None          General Cardiac Education Topics:  AED/CPR: - Group verbal and written instruction with the use of models to demonstrate the basic use of the AED with the basic ABC's of resuscitation.   Anatomy and Cardiac Procedures: - Group verbal and visual presentation and models provide information about basic cardiac anatomy and function. Reviews the testing methods done to diagnose heart disease and the outcomes of the test results. Describes the treatment choices: Medical Management, Angioplasty, or Coronary Bypass Surgery for treating various heart conditions including Myocardial Infarction, Angina, Valve Disease, and Cardiac Arrhythmias.  Written material given at graduation.   Medication Safety: - Group verbal and visual instruction to review commonly prescribed medications for heart and lung disease. Reviews  the medication, class of the drug, and side effects. Includes the steps to properly store meds and maintain the prescription regimen.  Written material given at graduation.   Intimacy: - Group verbal instruction through game format to discuss how heart and lung disease can affect sexual intimacy. Written material given at graduation..   Know Your Numbers and Heart Failure: - Group verbal and visual instruction to discuss disease risk factors for cardiac and pulmonary disease and treatment options.  Reviews associated critical values for Overweight/Obesity, Hypertension, Cholesterol, and Diabetes.  Discusses basics of heart failure: signs/symptoms and treatments.  Introduces Heart Failure Zone chart for action plan for heart failure.  Written material given at graduation.   Infection Prevention: - Provides verbal and written material to individual with discussion of infection control including proper hand washing and proper equipment cleaning during exercise session. Flowsheet Row Cardiac Rehab from 11/02/2023 in [facility] Cardiac and Pulmonary Rehab  Date 11/02/23  Educator [facility]  Instruction Review Code 1- Verbalizes Understanding    Falls Prevention: - Provides verbal and written material to individual with discussion of falls prevention and safety. Flowsheet Row Cardiac Rehab from 11/02/2023 in [facility] Cardiac and Pulmonary Rehab  Date 11/02/23  Educator [facility]  Instruction Review Code 1- Verbalizes Understanding    Other: -Provides group and verbal instruction on various topics (see comments)   Knowledge Questionnaire Score:   Core Components/Risk Factors/Patient Goals at Admission:  Personal Goals and Risk Factors at Admission - 10/27/23 1426       Core Components/Risk Factors/Patient Goals on Admission    Weight Management Yes;Weight Loss    Intervention Weight Management: Develop a combined nutrition and exercise program designed to reach desired caloric intake, while maintaining  appropriate intake of nutrient and fiber, sodium and fats, and appropriate energy expenditure required for the weight goal.;Weight Management: Provide education and appropriate resources to help participant work on and attain dietary goals.;Weight Management/Obesity: Establish reasonable short term and long term weight goals.    Expected Outcomes Short Term: Continue to assess and modify interventions until short term weight is achieved;Weight Loss: Understanding of general recommendations for a balanced deficit meal plan, which promotes 1-2 lb weight loss per week and includes a negative energy balance of [phone] kcal/d;Understanding recommendations for meals to include 15-35% energy as protein, 25-35% energy from fat, 35-60% energy from carbohydrates, less than 200mg  of dietary cholesterol, 20-35 gm of total fiber daily;Understanding of distribution of calorie intake throughout the day with the consumption of 4-5 meals/snacks    Hypertension Yes    Intervention Provide education on lifestyle modifcations including regular physical activity/exercise, weight management, moderate sodium restriction and increased consumption of fresh fruit, vegetables, and low fat dairy, alcohol moderation, and smoking cessation.;Monitor prescription use compliance.    Expected Outcomes Short Term: Continued assessment and intervention until BP is < 140/79mm HG in hypertensive participants. < 130/33mm HG in hypertensive participants with diabetes, heart failure or chronic kidney disease.;Long Term: Maintenance of blood pressure at goal levels.          Education:Diabetes - Individual verbal and written instruction to review signs/symptoms of diabetes, desired ranges of glucose level fasting, after meals and with exercise. Acknowledge that pre and post exercise glucose checks will be done for 3 sessions at entry of program.   Core Components/Risk Factors/Patient Goals Review:    Core Components/Risk Factors/Patient  Goals at Discharge (Final Review):    ITP Comments:  ITP Comments     Row Name 10/27/23 1428 11/02/23 1508 11/08/23 1718 11/24/23 0742     ITP Comments Virtual Visit completed. Patient informed on EP and RD appointment and 6 Minute walk test. Patient also informed of patient health questionnaires on My Chart. Patient Verbalizes understanding. Visit diagnosis can be found in [facility] 10/15/2023. Completed and gym orientation for cardiac rehab. Initial ITP created and sent for review to Dr. Oneil Pinal, Medical Director. First full day of exercise!  Patient was oriented to gym and equipment including functions, settings, policies, and procedures.  Patient's individual exercise prescription and treatment plan were reviewed.  All starting workloads were established based on the results of the 6 minute walk test done at initial orientation visit.  The plan for exercise progression was also introduced and progression will be customized based on patient's performance and goals. 30 Day review completed. Medical Director ITP review done, changes made as directed, and signed approval by Medical Director. New to program.       Comments: 30 day review

## 2023-11-25 ENCOUNTER — Encounter: Admitting: *Deleted

## 2023-11-25 DIAGNOSIS — I214 Non-ST elevation (NSTEMI) myocardial infarction: Secondary | ICD-10-CM

## 2023-11-25 NOTE — Progress Notes (Signed)
 Daily Session Note  Patient Details  Name: Angel Douglas MRN: [id number] Date of Birth: 18-Oct-1960 Referring Provider:   Flowsheet Row Cardiac Rehab from 11/02/2023 in [facility] Cardiac and Pulmonary Rehab  Referring Provider Dr. Cara Lovelace    Encounter Date: 11/25/2023  Check In:  Session Check In - 11/25/23 1718       Check-In   Supervising physician immediately available to respond to emergencies See telemetry face sheet for immediately available ER MD    Location [facility]-Cardiac & Pulmonary Rehab    Staff Present Hoy Rodney RN,BSN;Joseph [facility] BS, Exercise Physiologist;Margaret Best, MS, Exercise Physiologist    Virtual Visit No    Medication changes reported     No    Fall or balance concerns reported    No    Warm-up and Cool-down Performed on first and last piece of equipment    Resistance Training Performed Yes    VAD Patient? No    PAD/SET Patient? No      Pain Assessment   Currently in Pain? No/denies             Social History   Tobacco Use  Smoking Status Former   Types: Cigarettes   Start date: 10/27/1998   Quit date: 10/27/1983   Years since quitting: 40.1  Smokeless Tobacco Never  Tobacco Comments   Quit 25ya, 15 pack year    Goals Met:  Independence with exercise equipment Exercise tolerated well No report of concerns or symptoms today Strength training completed today  Goals Unmet:  Not Applicable  Comments: Pt able to follow exercise prescription today without complaint.  Will continue to monitor for progression.    Dr. Oneil Pinal is Medical Director for [facility] Cardiac Rehabilitation.  Dr. Fuad Aleskerov is Medical Director for [facility] Pulmonary Rehabilitation.

## 2023-11-29 ENCOUNTER — Encounter: Attending: Internal Medicine | Admitting: *Deleted

## 2023-11-29 DIAGNOSIS — I252 Old myocardial infarction: Secondary | ICD-10-CM | POA: Diagnosis not present

## 2023-11-29 DIAGNOSIS — Z5189 Encounter for other specified aftercare: Secondary | ICD-10-CM | POA: Diagnosis not present

## 2023-11-29 DIAGNOSIS — I214 Non-ST elevation (NSTEMI) myocardial infarction: Secondary | ICD-10-CM

## 2023-11-29 NOTE — Progress Notes (Signed)
 Daily Session Note  Patient Details  Name: Angel Douglas MRN: [id number] Date of Birth: 1960/09/15 Referring Provider:   Flowsheet Row Cardiac Rehab from 11/02/2023 in [facility] Cardiac and Pulmonary Rehab  Referring Provider Dr. Cara Lovelace    Encounter Date: 11/29/2023  Check In:  Session Check In - 11/29/23 1725       Check-In   Supervising physician immediately available to respond to emergencies See telemetry face sheet for immediately available ER MD    Location [facility]-Cardiac & Pulmonary Rehab    Staff Present Hoy Rodney RN,BSN;Joseph [facility] Dyane BS, ACSM CEP, Exercise Physiologist    Virtual Visit No    Medication changes reported     No    Fall or balance concerns reported    No    Warm-up and Cool-down Performed on first and last piece of equipment    Resistance Training Performed Yes    VAD Patient? No    PAD/SET Patient? No      Pain Assessment   Currently in Pain? No/denies             Social History   Tobacco Use  Smoking Status Former   Types: Cigarettes   Start date: 10/27/1998   Quit date: 10/27/1983   Years since quitting: 40.1  Smokeless Tobacco Never  Tobacco Comments   Quit 25ya, 15 pack year    Goals Met:  Independence with exercise equipment Exercise tolerated well No report of concerns or symptoms today Strength training completed today  Goals Unmet:  Not Applicable  Comments: Pt able to follow exercise prescription today without complaint.  Will continue to monitor for progression.    Dr. Oneil Pinal is Medical Director for [facility] Cardiac Rehabilitation.  Dr. Fuad Aleskerov is Medical Director for [facility] Pulmonary Rehabilitation.

## 2023-12-01 ENCOUNTER — Encounter

## 2023-12-01 DIAGNOSIS — I252 Old myocardial infarction: Secondary | ICD-10-CM | POA: Diagnosis not present

## 2023-12-01 DIAGNOSIS — I214 Non-ST elevation (NSTEMI) myocardial infarction: Secondary | ICD-10-CM

## 2023-12-01 NOTE — Progress Notes (Signed)
 Daily Session Note  Patient Details  Name: Angel Douglas MRN: [id number] Date of Birth: 03/29/1961 Referring Provider:   Flowsheet Row Cardiac Rehab from 11/02/2023 in [facility] Cardiac and Pulmonary Rehab  Referring Provider Dr. Cara Lovelace    Encounter Date: 12/01/2023  Check In:  Session Check In - 12/01/23 1728       Check-In   Supervising physician immediately available to respond to emergencies See telemetry face sheet for immediately available ER MD    Location [facility]-Cardiac & Pulmonary Rehab    Staff Present Burnard Davenport [facility] Dyane BS, ACSM CEP, Exercise Physiologist;Joseph Rolinda RCP,RRT,BSRT    Virtual Visit No    Medication changes reported     No    Fall or balance concerns reported    No    Tobacco Cessation No Change    Warm-up and Cool-down Performed on first and last piece of equipment    Resistance Training Performed Yes    VAD Patient? No    PAD/SET Patient? No      Pain Assessment   Currently in Pain? No/denies             Social History   Tobacco Use  Smoking Status Former   Types: Cigarettes   Start date: 10/27/1998   Quit date: 10/27/1983   Years since quitting: 40.1  Smokeless Tobacco Never  Tobacco Comments   Quit 25ya, 15 pack year    Goals Met:  Independence with exercise equipment Exercise tolerated well No report of concerns or symptoms today Strength training completed today  Goals Unmet:  Not Applicable  Comments: Pt able to follow exercise prescription today without complaint.  Will continue to monitor for progression.    Dr. Oneil Pinal is Medical Director for [facility] Cardiac Rehabilitation.  Dr. Fuad Aleskerov is Medical Director for [facility] Pulmonary Rehabilitation.

## 2023-12-02 ENCOUNTER — Encounter: Admitting: *Deleted

## 2023-12-02 DIAGNOSIS — I214 Non-ST elevation (NSTEMI) myocardial infarction: Secondary | ICD-10-CM

## 2023-12-02 DIAGNOSIS — I252 Old myocardial infarction: Secondary | ICD-10-CM | POA: Diagnosis not present

## 2023-12-02 NOTE — Progress Notes (Signed)
 Daily Session Note  Patient Details  Name: Angel Douglas MRN: [id number] Date of Birth: August 23, 1960 Referring Provider:   Flowsheet Row Cardiac Rehab from 11/02/2023 in [facility] Cardiac and Pulmonary Rehab  Referring Provider Dr. Cara Lovelace    Encounter Date: 12/02/2023  Check In:  Session Check In - 12/02/23 1725       Check-In   Supervising physician immediately available to respond to emergencies See telemetry face sheet for immediately available ER MD    Location [facility]-Cardiac & Pulmonary Rehab    Staff Present Maxon Conetta BS, Exercise Physiologist;Ludy Messamore Tressa RN,BSN;Joseph Hood RCP,RRT,BSRT    Virtual Visit No    Medication changes reported     No    Fall or balance concerns reported    No    Warm-up and Cool-down Performed on first and last piece of equipment    Resistance Training Performed Yes    VAD Patient? No    PAD/SET Patient? No      Pain Assessment   Currently in Pain? No/denies             Social History   Tobacco Use  Smoking Status Former   Types: Cigarettes   Start date: 10/27/1998   Quit date: 10/27/1983   Years since quitting: 40.1  Smokeless Tobacco Never  Tobacco Comments   Quit 25ya, 15 pack year    Goals Met:  Independence with exercise equipment Exercise tolerated well No report of concerns or symptoms today Strength training completed today  Goals Unmet:  Not Applicable  Comments: Pt able to follow exercise prescription today without complaint.  Will continue to monitor for progression.    Dr. Oneil Pinal is Medical Director for [facility] Cardiac Rehabilitation.  Dr. Fuad Aleskerov is Medical Director for [facility] Pulmonary Rehabilitation.

## 2023-12-06 ENCOUNTER — Encounter: Admitting: *Deleted

## 2023-12-06 DIAGNOSIS — I252 Old myocardial infarction: Secondary | ICD-10-CM | POA: Diagnosis not present

## 2023-12-06 DIAGNOSIS — I214 Non-ST elevation (NSTEMI) myocardial infarction: Secondary | ICD-10-CM

## 2023-12-06 NOTE — Progress Notes (Signed)
 Daily Session Note  Patient Details  Name: Angel Douglas MRN: [id number] Date of Birth: 1961/01/19 Referring Provider:   Flowsheet Row Cardiac Rehab from 11/02/2023 in [facility] Cardiac and Pulmonary Rehab  Referring Provider Dr. Cara Lovelace    Encounter Date: 12/06/2023  Check In:  Session Check In - 12/06/23 1721       Check-In   Supervising physician immediately available to respond to emergencies See telemetry face sheet for immediately available ER MD    Location [facility]-Cardiac & Pulmonary Rehab    Staff Present Hoy Rodney RN,BSN;Joseph [facility] BS, ACSM CEP, Exercise Physiologist    Virtual Visit No    Medication changes reported     No    Fall or balance concerns reported    No    Tobacco Cessation No Change    Warm-up and Cool-down Performed on first and last piece of equipment    Resistance Training Performed Yes    VAD Patient? No    PAD/SET Patient? No      Pain Assessment   Currently in Pain? No/denies             Social History   Tobacco Use  Smoking Status Former   Types: Cigarettes   Start date: 10/27/1998   Quit date: 10/27/1983   Years since quitting: 40.1  Smokeless Tobacco Never  Tobacco Comments   Quit 25ya, 15 pack year    Goals Met:  Independence with exercise equipment Exercise tolerated well No report of concerns or symptoms today Strength training completed today  Goals Unmet:  Not Applicable  Comments: Pt able to follow exercise prescription today without complaint.  Will continue to monitor for progression.     Dr. Oneil Pinal is Medical Director for [facility] Cardiac Rehabilitation  Reviewed home exercise with pt today.  Pt plans to go to her community gym for exercise.  Reviewed THR, pulse, RPE, sign and symptoms, pulse oximetery and when to call 911 or MD.  Also discussed weather considerations and indoor options.  Pt voiced understanding..  Dr. Fuad Aleskerov is Medical Director for [facility]  Pulmonary Rehabilitation.

## 2023-12-08 ENCOUNTER — Encounter: Admitting: Emergency Medicine

## 2023-12-08 DIAGNOSIS — I214 Non-ST elevation (NSTEMI) myocardial infarction: Secondary | ICD-10-CM

## 2023-12-08 DIAGNOSIS — I252 Old myocardial infarction: Secondary | ICD-10-CM | POA: Diagnosis not present

## 2023-12-08 NOTE — Progress Notes (Signed)
 Daily Session Note  Patient Details  Name: Angel Douglas MRN: [id number] Date of Birth: [birth year] Referring Provider:   Flowsheet Row Cardiac Rehab from 11/02/2023 in [facility] Cardiac and Pulmonary Rehab  Referring Provider Dr. Cara Lovelace    Encounter Date: 12/08/2023  Check In:  Session Check In - 12/08/23 1719       Check-In   Supervising physician immediately available to respond to emergencies See telemetry face sheet for immediately available ER MD    Location [facility]-Cardiac & Pulmonary Rehab    Staff Present Fairy Plater RCP,RRT,BSRT;Kelly Dyane BS, ACSM CEP, Exercise Physiologist;Kelly [facility]    Virtual Visit No    Medication changes reported     No    Fall or balance concerns reported    No    Tobacco Cessation No Change    Warm-up and Cool-down Performed on first and last piece of equipment    Resistance Training Performed Yes    VAD Patient? No    PAD/SET Patient? No      Pain Assessment   Currently in Pain? No/denies             Social History   Tobacco Use  Smoking Status Former   Types: Cigarettes   Start date: 10/27/1998   Quit date: 10/27/1983   Years since quitting: 40.1  Smokeless Tobacco Never  Tobacco Comments   Quit 25ya, 15 pack year    Goals Met:  Independence with exercise equipment Exercise tolerated well No report of concerns or symptoms today Strength training completed today  Goals Unmet:  Not Applicable  Comments: Pt able to follow exercise prescription today without complaint.  Will continue to monitor for progression.    Dr. Oneil Pinal is Medical Director for [facility] Cardiac Rehabilitation.  Dr. Fuad Aleskerov is Medical Director for [facility] Pulmonary Rehabilitation.

## 2023-12-09 ENCOUNTER — Encounter: Admitting: Emergency Medicine

## 2023-12-09 DIAGNOSIS — I214 Non-ST elevation (NSTEMI) myocardial infarction: Secondary | ICD-10-CM

## 2023-12-09 DIAGNOSIS — I252 Old myocardial infarction: Secondary | ICD-10-CM | POA: Diagnosis not present

## 2023-12-09 NOTE — Progress Notes (Signed)
 Daily Session Note  Patient Details  Name: Angel Douglas MRN: [id number] Date of Birth: [birth year] Referring Provider:   Flowsheet Row Cardiac Rehab from 11/02/2023 in [facility] Cardiac and Pulmonary Rehab  Referring Provider Dr. Cara Lovelace    Encounter Date: 12/09/2023  Check In:  Session Check In - 12/09/23 1714       Check-In   Supervising physician immediately available to respond to emergencies See telemetry face sheet for immediately available ER MD    Location [facility]-Cardiac & Pulmonary Rehab    Staff Present Maxon Conetta BS, Exercise Physiologist;Joseph [facility] RN,BSN;Margaret Best, MS, Exercise Physiologist    Virtual Visit No    Medication changes reported     No    Fall or balance concerns reported    No    Tobacco Cessation No Change    Warm-up and Cool-down Performed on first and last piece of equipment    Resistance Training Performed Yes    VAD Patient? No    PAD/SET Patient? No      Pain Assessment   Currently in Pain? No/denies             Social History   Tobacco Use  Smoking Status Former   Types: Cigarettes   Start date: 10/27/1998   Quit date: 10/27/1983   Years since quitting: 40.1  Smokeless Tobacco Never  Tobacco Comments   Quit 25ya, 15 pack year    Goals Met:  Independence with exercise equipment Exercise tolerated well No report of concerns or symptoms today Strength training completed today  Goals Unmet:  Not Applicable  Comments: Pt able to follow exercise prescription today without complaint.  Will continue to monitor for progression.    Dr. Oneil Pinal is Medical Director for [facility] Cardiac Rehabilitation.  Dr. Fuad Aleskerov is Medical Director for [facility] Pulmonary Rehabilitation.

## 2023-12-13 ENCOUNTER — Encounter: Admitting: Emergency Medicine

## 2023-12-13 DIAGNOSIS — I252 Old myocardial infarction: Secondary | ICD-10-CM | POA: Diagnosis not present

## 2023-12-13 DIAGNOSIS — I214 Non-ST elevation (NSTEMI) myocardial infarction: Secondary | ICD-10-CM

## 2023-12-13 NOTE — Progress Notes (Signed)
 Daily Session Note  Patient Details  Name: Angel Douglas MRN: [id number] Date of Birth: 1961-03-02 Referring Provider:   Flowsheet Row Cardiac Rehab from 11/02/2023 in [facility] Cardiac and Pulmonary Rehab  Referring Provider Dr. Cara Lovelace    Encounter Date: 12/13/2023  Check In:  Session Check In - 12/13/23 1721       Check-In   Supervising physician immediately available to respond to emergencies See telemetry face sheet for immediately available ER MD    Location [facility]-Cardiac & Pulmonary Rehab    Staff Present Othel Durand, RN, BSN, CCRP;Joseph Hood RCP,RRT,BSRT;Lauren Silvino Selman RN,BSN;Kelly [place] BS, ACSM CEP, Exercise Physiologist    Virtual Visit No    Medication changes reported     No    Fall or balance concerns reported    No    Tobacco Cessation No Change    Warm-up and Cool-down Performed on first and last piece of equipment    Resistance Training Performed Yes    VAD Patient? No    PAD/SET Patient? No      Pain Assessment   Currently in Pain? No/denies             Social History   Tobacco Use  Smoking Status Former   Types: Cigarettes   Start date: 10/27/1998   Quit date: 10/27/1983   Years since quitting: 40.1  Smokeless Tobacco Never  Tobacco Comments   Quit 25ya, 15 pack year    Goals Met:  Independence with exercise equipment Exercise tolerated well Personal goals reviewed No report of concerns or symptoms today Strength training completed today  Goals Unmet:  Not Applicable  Comments: Pt able to follow exercise prescription today without complaint.  Will continue to monitor for progression.    Dr. Oneil Pinal is Medical Director for [facility] Cardiac Rehabilitation.  Dr. Fuad Aleskerov is Medical Director for [facility] Pulmonary Rehabilitation.

## 2023-12-15 ENCOUNTER — Encounter

## 2023-12-15 DIAGNOSIS — I214 Non-ST elevation (NSTEMI) myocardial infarction: Secondary | ICD-10-CM

## 2023-12-15 DIAGNOSIS — I252 Old myocardial infarction: Secondary | ICD-10-CM | POA: Diagnosis not present

## 2023-12-15 NOTE — Progress Notes (Signed)
 Daily Session Note  Patient Details  Name: Angel Douglas MRN: [id number] Date of Birth: 1961/03/19 Referring Provider:   Flowsheet Row Cardiac Rehab from 11/02/2023 in [facility] Cardiac and Pulmonary Rehab  Referring Provider Dr. Cara Lovelace    Encounter Date: 12/15/2023  Check In:  Session Check In - 12/15/23 1720       Check-In   Supervising physician immediately available to respond to emergencies See telemetry face sheet for immediately available ER MD    Location [facility]-Cardiac & Pulmonary Rehab    Staff Present Burnard Davenport RN,BSN,MPA;Joseph [facility] Dyane BS, ACSM CEP, Exercise Physiologist    Virtual Visit No    Medication changes reported     No    Fall or balance concerns reported    No    Tobacco Cessation No Change    Warm-up and Cool-down Performed on first and last piece of equipment    Resistance Training Performed Yes    VAD Patient? No    PAD/SET Patient? No      Pain Assessment   Currently in Pain? No/denies             Social History   Tobacco Use  Smoking Status Former   Types: Cigarettes   Start date: 10/27/1998   Quit date: 10/27/1983   Years since quitting: 40.1  Smokeless Tobacco Never  Tobacco Comments   Quit 25ya, 15 pack year    Goals Met:  Independence with exercise equipment Exercise tolerated well No report of concerns or symptoms today Strength training completed today  Goals Unmet:  Not Applicable  Comments: Pt able to follow exercise prescription today without complaint.  Will continue to monitor for progression.    Dr. Oneil Pinal is Medical Director for [facility] Cardiac Rehabilitation.  Dr. Fuad Aleskerov is Medical Director for [facility] Pulmonary Rehabilitation.

## 2023-12-16 ENCOUNTER — Encounter: Admitting: Emergency Medicine

## 2023-12-16 DIAGNOSIS — I214 Non-ST elevation (NSTEMI) myocardial infarction: Secondary | ICD-10-CM

## 2023-12-16 DIAGNOSIS — I252 Old myocardial infarction: Secondary | ICD-10-CM | POA: Diagnosis not present

## 2023-12-16 NOTE — Progress Notes (Signed)
 Daily Session Note  Patient Details  Name: Angel Douglas MRN: [id number] Date of Birth: Sep 06, 1960 Referring Provider:   Flowsheet Row Cardiac Rehab from 11/02/2023 in [facility] Cardiac and Pulmonary Rehab  Referring Provider Dr. Cara Lovelace    Encounter Date: 12/16/2023  Check In:  Session Check In - 12/16/23 1726       Check-In   Supervising physician immediately available to respond to emergencies See telemetry face sheet for immediately available ER MD    Location [facility]-Cardiac & Pulmonary Rehab    Staff Present Maxon Conetta BS, Exercise Physiologist;Meredith Tressa RN,BSN;Joseph Rolinda RCP,RRT,BSRT;Corneilus Heggie RN,BSN    Virtual Visit No    Medication changes reported     No    Fall or balance concerns reported    No    Tobacco Cessation No Change    Warm-up and Cool-down Performed on first and last piece of equipment    Resistance Training Performed Yes    VAD Patient? No    PAD/SET Patient? No      Pain Assessment   Currently in Pain? No/denies             Social History   Tobacco Use  Smoking Status Former   Types: Cigarettes   Start date: 10/27/1998   Quit date: 10/27/1983   Years since quitting: 40.1  Smokeless Tobacco Never  Tobacco Comments   Quit 25ya, 15 pack year    Goals Met:  Independence with exercise equipment Exercise tolerated well No report of concerns or symptoms today Strength training completed today  Goals Unmet:  Not Applicable  Comments: Pt able to follow exercise prescription today without complaint.  Will continue to monitor for progression.    Dr. Oneil Pinal is Medical Director for [facility] Cardiac Rehabilitation.  Dr. Fuad Aleskerov is Medical Director for [facility] Pulmonary Rehabilitation.

## 2023-12-20 ENCOUNTER — Encounter

## 2023-12-22 ENCOUNTER — Encounter

## 2023-12-22 DIAGNOSIS — I214 Non-ST elevation (NSTEMI) myocardial infarction: Secondary | ICD-10-CM

## 2023-12-22 NOTE — Progress Notes (Signed)
 Cardiac Individual Treatment Plan  Patient Details  Name: Angel Douglas MRN: [id number] Date of Birth: [birth year] Referring Provider:   Flowsheet Row Cardiac Rehab from 11/02/2023 in [facility] Cardiac and Pulmonary Rehab  Referring Provider Dr. Cara Lovelace    Initial Encounter Date:  Flowsheet Row Cardiac Rehab from 11/02/2023 in [facility] Cardiac and Pulmonary Rehab  Date 11/02/23    Visit Diagnosis: NSTEMI (non-ST elevated myocardial infarction) [facility])  Patient's Home Medications on Admission:  Current Outpatient Medications:    acetaminophen  (TYLENOL ) 325 MG tablet, Take 2 tablets (650 mg total) by mouth every 6 (six) hours as needed for mild pain (or Fever >/= 101)., Disp: 20 tablet, Rfl: 0   ALLEGRA-D ALLERGY & CONGESTION 180-240 MG 24 hr tablet, Take 1 tablet by mouth daily., Disp: , Rfl:    aspirin  EC 81 MG tablet, Take 1 tablet (81 mg total) by mouth daily. Swallow whole., Disp: 30 tablet, Rfl: 11   buPROPion (WELLBUTRIN XL) 300 MG 24 hr tablet, Take 300 mg by mouth daily., Disp: , Rfl:    clopidogrel  (PLAVIX ) 75 MG tablet, Take 1 tablet (75 mg total) by mouth daily., Disp: 30 tablet, Rfl: 2   estradiol (ESTRACE) 0.1 MG/GM vaginal cream, Place vaginally., Disp: , Rfl:    isosorbide  mononitrate (IMDUR ) 30 MG 24 hr tablet, Take 1 tablet (30 mg total) by mouth daily. (Patient not taking: Reported on 10/27/2023), Disp: 30 tablet, Rfl: 11   metoprolol  succinate (TOPROL -XL) 25 MG 24 hr tablet, Take 0.5 tablets (12.5 mg total) by mouth daily. (Patient not taking: Reported on 10/27/2023), Disp: 15 tablet, Rfl: 11   MOUNJARO 15 MG/0.5ML Pen, , Disp: , Rfl:    Multiple Vitamin (MULTI-VITAMINS) TABS, Take by mouth., Disp: , Rfl:    rosuvastatin  (CRESTOR ) 5 MG tablet, Take 1 tablet (5 mg total) by mouth daily., Disp: 30 tablet, Rfl: 11  Past Medical History: Past Medical History:  Diagnosis Date   Allergy    Depression    Hypertension    Preeclampsia     Tobacco Use: Social History    Tobacco Use  Smoking Status Former   Types: Cigarettes   Start date: 10/27/1998   Quit date: 10/27/1983   Years since quitting: 40.1  Smokeless Tobacco Never  Tobacco Comments   Quit 25ya, 15 pack year    Labs: Review Flowsheet       Latest Ref Rng & Units 10/16/2023  Labs for ITP Cardiac and Pulmonary Rehab  Cholestrol 0 - 200 mg/dL 845   LDL (calc) 0 - 99 mg/dL 92   HDL-C >59 mg/dL 54   Trlycerides <849 mg/dL 42      Exercise Target Goals: Exercise Program Goal: Individual exercise prescription set using results from initial 6 min walk test and THRR while considering  patient's activity barriers and safety.   Exercise Prescription Goal: Initial exercise prescription builds to 30-45 minutes a day of aerobic activity, 2-3 days per week.  Home exercise guidelines will be given to patient during program as part of exercise prescription that the participant will acknowledge.   Education: Aerobic Exercise: - Group verbal and visual presentation on the components of exercise prescription. Introduces F.I.T.T principle from ACSM for exercise prescriptions.  Reviews F.I.T.T. principles of aerobic exercise including progression. Written material provided at class time.   Education: Resistance Exercise: - Group verbal and visual presentation on the components of exercise prescription. Introduces F.I.T.T principle from ACSM for exercise prescriptions  Reviews F.I.T.T. principles of resistance exercise including progression.  Written material provided at class time.    Education: Exercise & Equipment Safety: - Individual verbal instruction and demonstration of equipment use and safety with use of the equipment. Flowsheet Row Cardiac Rehab from 11/02/2023 in [facility] Cardiac and Pulmonary Rehab  Date 11/02/23  Educator [facility]  Instruction Review Code 1- Verbalizes Understanding    Education: Exercise Physiology & General Exercise Guidelines: - Group verbal and written instruction with models  to review the exercise physiology of the cardiovascular system and associated critical values. Provides general exercise guidelines with specific guidelines to those with heart or lung disease. Written material provided at class time.   Education: Flexibility, Balance, Mind/Body Relaxation: - Group verbal and visual presentation with interactive activity on the components of exercise prescription. Introduces F.I.T.T principle from ACSM for exercise prescriptions. Reviews F.I.T.T. principles of flexibility and balance exercise training including progression. Also discusses the mind body connection.  Reviews various relaxation techniques to help reduce and manage stress (i.e. Deep breathing, progressive muscle relaxation, and visualization). Balance handout provided to take home. Written material provided at class time.   Activity Barriers & Risk Stratification:  Activity Barriers & Cardiac Risk Stratification - 11/02/23 1510       Activity Barriers & Cardiac Risk Stratification   Activity Barriers None    Cardiac Risk Stratification Moderate          6 Minute Walk:  6 Minute Walk     Row Name 11/02/23 1509         6 Minute Walk   Phase Initial     Distance 1440 feet     Walk Time 6 minutes     # of Rest Breaks 0     MPH 2.73     METS 3.43     RPE 7     Perceived Dyspnea  0     VO2 Peak 12     Symptoms No     Resting HR 64 bpm     Resting BP 106/64     Resting Oxygen Saturation  96 %     Exercise Oxygen Saturation  during 6 min walk 96 %     Max Ex. HR 101 bpm     Max Ex. BP 124/66     2 Minute Post BP 114/60        Oxygen Initial Assessment:   Oxygen Re-Evaluation:   Oxygen Discharge (Final Oxygen Re-Evaluation):   Initial Exercise Prescription:  Initial Exercise Prescription - 11/02/23 1500       Date of Initial Exercise RX and Referring Provider   Date 11/02/23    Referring Provider Dr. Cara Lovelace      Oxygen   Maintain Oxygen Saturation 88% or  higher      Treadmill   MPH 2.7    Grade 0    Minutes 15    METs 3.07      NuStep   Level 2    SPM 80    Minutes 15    METs 3.43      REL-XR   Level 2    Watts 25    Speed 50    Minutes 15    METs 3.43      T5 Nustep   Level 2   T6   SPM 80    Minutes 15    METs 3.43      Prescription Details   Duration Progress to 30 minutes of continuous aerobic without signs/symptoms of physical distress  Intensity   THRR 40-80% of Max Heartrate 101-139    Ratings of Perceived Exertion 11-13    Perceived Dyspnea 0-4      Progression   Progression Continue to progress workloads to maintain intensity without signs/symptoms of physical distress.      Resistance Training   Training Prescription Yes    Weight 7lb    Reps 10-15          Perform Capillary Blood Glucose checks as needed.  Exercise Prescription Changes:   Exercise Prescription Changes     Row Name 11/02/23 1500 11/19/23 1000 12/01/23 1500 12/06/23 1700 12/13/23 1600     Response to Exercise   Blood Pressure (Admit) 106/64 108/72 118/58 -- 120/60   Blood Pressure (Exercise) 124/66 140/58 156/82 -- --   Blood Pressure (Exit) 114/66 96/70 118/64 -- 100/62   Heart Rate (Admit) 64 bpm 72 bpm 70 bpm -- 70 bpm   Heart Rate (Exercise) 101 bpm 122 bpm 131 bpm -- 124 bpm   Heart Rate (Exit) 66 bpm 90 bpm 74 bpm -- 88 bpm   Oxygen Saturation (Admit) 96 % -- -- -- --   Oxygen Saturation (Exercise) 96 % -- -- -- --   Oxygen Saturation (Exit) 96 % -- -- -- --   Rating of Perceived Exertion (Exercise) 7 15 15  -- 15   Perceived Dyspnea (Exercise) 0 0 0 -- 0   Symptoms none none none -- none   Comments results first 2 weeks of exercise -- -- --   Duration -- Progress to 30 minutes of  aerobic without signs/symptoms of physical distress Progress to 30 minutes of  aerobic without signs/symptoms of physical distress Progress to 30 minutes of  aerobic without signs/symptoms of physical distress Continue with 30 min  of aerobic exercise without signs/symptoms of physical distress.   Intensity -- THRR unchanged THRR unchanged THRR unchanged THRR unchanged     Progression   Progression -- Continue to progress workloads to maintain intensity without signs/symptoms of physical distress. Continue to progress workloads to maintain intensity without signs/symptoms of physical distress. Continue to progress workloads to maintain intensity without signs/symptoms of physical distress. Continue to progress workloads to maintain intensity without signs/symptoms of physical distress.   Average METs -- 5.2 5.38 5.38 4.94     Resistance Training   Training Prescription -- Yes Yes Yes Yes   Weight -- 7lb 7lb 7lb 3 lb   Reps -- 10-15 10-15 10-15 10-15     Interval Training   Interval Training -- No No No No     Treadmill   MPH -- 3.3 3 3  3.4   Grade -- 5 8 8 7    Minutes -- 15 15 15 15    METs -- 5.8 6.61 6.61 6.88     NuStep   Level -- 2 2 2 4   T6: 4   Minutes -- 15 15 15 15    METs -- 3.1 3.7 3.7 4  T6: 2.5     REL-XR   Level -- 5 10 10 7    Minutes -- 15 15 15 15    METs -- 6.8 7.3 7.3 6     T5 Nustep   Level -- -- 2 2 --   Minutes -- -- 15 15 --   METs -- -- 3 3 --     Rower   Level -- -- 3 3 2    Watts -- -- 41 41 39   Minutes -- -- 15 15 15  METs -- -- 4.37 4.37 5.57     Home Exercise Plan   Plans to continue exercise at -- -- -- [employer] (comment) [employer] (comment)   Frequency -- -- -- Add 2 additional days to program exercise sessions. Add 2 additional days to program exercise sessions.   Initial Home Exercises Provided -- -- -- 12/06/23 12/06/23     Oxygen   Maintain Oxygen Saturation -- 88% or higher 88% or higher 88% or higher 88% or higher      Exercise Comments:   Exercise Comments     Row Name 11/08/23 1719           Exercise Comments First full day of exercise!  Patient was oriented to gym and equipment including functions, settings, policies, and  procedures.  Patient's individual exercise prescription and treatment plan were reviewed.  All starting workloads were established based on the results of the 6 minute walk test done at initial orientation visit.  The plan for exercise progression was also introduced and progression will be customized based on patient's performance and goals.          Exercise Goals and Review:   Exercise Goals     Row Name 11/02/23 1513             Exercise Goals   Increase Physical Activity Yes       Intervention Provide advice, education, support and counseling about physical activity/exercise needs.;Develop an individualized exercise prescription for aerobic and resistive training based on initial evaluation findings, risk stratification, comorbidities and participant's personal goals.       Expected Outcomes Short Term: Attend rehab on a regular basis to increase amount of physical activity.;Long Term: Add in home exercise to make exercise part of routine and to increase amount of physical activity.;Long Term: Exercising regularly at least 3-5 days a week.       Increase Strength and Stamina Yes       Intervention Provide advice, education, support and counseling about physical activity/exercise needs.;Develop an individualized exercise prescription for aerobic and resistive training based on initial evaluation findings, risk stratification, comorbidities and participant's personal goals.       Expected Outcomes Short Term: Increase workloads from initial exercise prescription for resistance, speed, and METs.;Short Term: Perform resistance training exercises routinely during rehab and add in resistance training at home;Long Term: Improve cardiorespiratory fitness, muscular endurance and strength as measured by increased METs and functional capacity ( )       Able to understand and use rate of perceived exertion (RPE) scale Yes       Intervention Provide education and explanation on how to use RPE scale        Expected Outcomes Short Term: Able to use RPE daily in rehab to express subjective intensity level;Long Term:  Able to use RPE to guide intensity level when exercising independently       Able to understand and use Dyspnea scale Yes       Intervention Provide education and explanation on how to use Dyspnea scale       Expected Outcomes Long Term: Able to use Dyspnea scale to guide intensity level when exercising independently;Short Term: Able to use Dyspnea scale daily in rehab to express subjective sense of shortness of breath during exertion       Knowledge and understanding of Target Heart Rate Range (THRR) Yes       Intervention Provide education and explanation of THRR including how the numbers were predicted and  where they are located for reference       Expected Outcomes Short Term: Able to state/look up THRR;Short Term: Able to use daily as guideline for intensity in rehab;Long Term: Able to use THRR to govern intensity when exercising independently       Able to check pulse independently Yes       Intervention Provide education and demonstration on how to check pulse in carotid and radial arteries.;Review the importance of being able to check your own pulse for safety during independent exercise       Expected Outcomes Short Term: Able to explain why pulse checking is important during independent exercise;Long Term: Able to check pulse independently and accurately       Understanding of Exercise Prescription Yes       Intervention Provide education, explanation, and written materials on patient's individual exercise prescription       Expected Outcomes Short Term: Able to explain program exercise prescription;Long Term: Able to explain home exercise prescription to exercise independently          Exercise Goals Re-Evaluation :  Exercise Goals Re-Evaluation     Row Name 11/08/23 1719 11/19/23 1029 12/01/23 1557 12/06/23 1736 12/13/23 1608     Exercise Goal Re-Evaluation   Exercise  Goals Review Increase Physical Activity;Knowledge and understanding of Target Heart Rate Range (THRR);Able to understand and use rate of perceived exertion (RPE) scale;Understanding of Exercise Prescription;Increase Strength and Stamina;Able to check pulse independently Increase Physical Activity;Increase Strength and Stamina;Understanding of Exercise Prescription Increase Physical Activity;Increase Strength and Stamina;Understanding of Exercise Prescription Increase Physical Activity;Increase Strength and Stamina;Able to understand and use rate of perceived exertion (RPE) scale;Able to understand and use Dyspnea scale;Knowledge and understanding of Target Heart Rate Range (THRR);Able to check pulse independently;Understanding of Exercise Prescription Increase Physical Activity;Increase Strength and Stamina;Understanding of Exercise Prescription   Comments Reviewed RPE and dyspnea scale, THR and program prescription with pt today.  Pt voiced understanding and was given a copy of goals to take home. Ishia is off to a great start in rehab. She was able to attend her first 3 sessions during this weeks review period. During these sessions she was able to increase from 2.7 to 3. on the treadmill, and increase to level 5 on the XR. We will continue to monitor her progress in the program. Starleen continues to do well in rehab. She was recently able to increase her treadmill incline from 5% to 8%, while maintaining a speed of . She was also able to increase from level 5 to level 10 on the XR. We will continue to monitor her progress in the program. Reviewed home exercise with pt today.  Pt plans to go to her community gym for exercise.  Reviewed THR, pulse, RPE, sign and symptoms, pulse oximetery and when to call 911 or MD.  Also discussed weather considerations and indoor options.  Pt voiced understanding. Ashonte continues to do well in rehab. She recently increased her treadmill workload to a speed of 3.4 mph with an  incline of 7%. She also improved to level 4 on the T4 nustep. She did decrease from 7 lb to 3 lb handweights for resistance training as well. We will continue to monitor her progress in the program.   Expected Outcomes Short: Use RPE daily to regulate intensity.  Long: Follow program prescription in THR. Short: Continue to follow exercise prescription. Long: Continue exercise to improve strength and stamina. Short: Continue to increase treadmill workload. Long: Continue exercise to  improev strength and stamina. Short: add 1-2 days per week of exercise on off days of cardiac rehab. Long: maintain independent exercise routine upon graduation from cardiac rehab. Short: Increase back up to 7 lb hand weights for resistance training. Long: Continue exercise to increase strength and stamina.      Discharge Exercise Prescription (Final Exercise Prescription Changes):  Exercise Prescription Changes - 12/13/23 1600       Response to Exercise   Blood Pressure (Admit) 120/60    Blood Pressure (Exit) 100/62    Heart Rate (Admit) 70 bpm    Heart Rate (Exercise) 124 bpm    Heart Rate (Exit) 88 bpm    Rating of Perceived Exertion (Exercise) 15    Perceived Dyspnea (Exercise) 0    Symptoms none    Duration Continue with 30 min of aerobic exercise without signs/symptoms of physical distress.    Intensity THRR unchanged      Progression   Progression Continue to progress workloads to maintain intensity without signs/symptoms of physical distress.    Average METs 4.94      Resistance Training   Training Prescription Yes    Weight 3 lb    Reps 10-15      Interval Training   Interval Training No      Treadmill   MPH 3.4    Grade 7    Minutes 15    METs 6.88      NuStep   Level 4   T6: 4   Minutes 15    METs 4   T6: 2.5     REL-XR   Level 7    Minutes 15    METs 6      Rower   Level 2    Watts 39    Minutes 15    METs 5.57      Home Exercise Plan   Plans to continue exercise at  [employer] (comment)    Frequency Add 2 additional days to program exercise sessions.    Initial Home Exercises Provided 12/06/23      Oxygen   Maintain Oxygen Saturation 88% or higher          Nutrition:  Target Goals: Understanding of nutrition guidelines, daily intake of sodium 1500mg , cholesterol 200mg , calories 30% from fat and 7% or less from saturated fats, daily to have 5 or more servings of fruits and vegetables.  Education: Nutrition 1 -Group instruction provided by verbal, written material, interactive activities, discussions, models, and posters to present general guidelines for heart healthy nutrition including macronutrients, label reading, and promoting whole foods over processed counterparts. Education serves as Pensions consultant of discussion of heart healthy eating for all. Written material provided at class time.    Education: Nutrition 2 -Group instruction provided by verbal, written material, interactive activities, discussions, models, and posters to present general guidelines for heart healthy nutrition including sodium, cholesterol, and saturated fat. Providing guidance of habit forming to improve blood pressure, cholesterol, and body weight. Written material provided at class time.     Biometrics:  Pre Biometrics - 11/02/23 1514       Pre Biometrics   Height 5' (1.524 m)    Weight 137 lb (62.1 kg)    Waist Circumference 30 inches    Hip Circumference 39 inches    Waist to Hip Ratio 0.77 %    BMI (Calculated) 26.76    Single Leg Stand 22 seconds           Nutrition  Therapy Plan and Nutrition Goals:  Nutrition Therapy & Goals - 12/13/23 1724       Nutrition Therapy   RD appointment deferred Yes          Nutrition Assessments:  MEDIFICTS Score Key: >=70 Need to make dietary changes  40-70 Heart Healthy Diet <= 40 Therapeutic Level Cholesterol Diet   Picture Your Plate Scores: <59 Unhealthy dietary pattern with much room for  improvement. 41-50 Dietary pattern unlikely to meet recommendations for good health and room for improvement. 51-60 More healthful dietary pattern, with some room for improvement.  >60 Healthy dietary pattern, although there may be some specific behaviors that could be improved.    Nutrition Goals Re-Evaluation:  Nutrition Goals Re-Evaluation     Row Name 12/13/23 1724             Goals   Comment Continues to defer RD apt.          Nutrition Goals Discharge (Final Nutrition Goals Re-Evaluation):  Nutrition Goals Re-Evaluation - 12/13/23 1724       Goals   Comment Continues to defer RD apt.          Psychosocial: Target Goals: Acknowledge presence or absence of significant depression and/or stress, maximize coping skills, provide positive support system. Participant is able to verbalize types and ability to use techniques and skills needed for reducing stress and depression.   Education: Stress, Anxiety, and Depression - Group verbal and visual presentation to define topics covered.  Reviews how body is impacted by stress, anxiety, and depression.  Also discusses healthy ways to reduce stress and to treat/manage anxiety and depression. Written material provided at class time.   Education: Sleep Hygiene -Provides group verbal and written instruction about how sleep can affect your health.  Define sleep hygiene, discuss sleep cycles and impact of sleep habits. Review good sleep hygiene tips.   Initial Review & Psychosocial Screening:  Initial Psych Review & Screening - 10/27/23 1430       Initial Review   Current issues with Current Depression;History of Depression      Family Dynamics   Good Support System? Yes    Comments Her depression started with family seperation and the climate that we live in right now. She can look to her daughter, friend and therapis for support.      Barriers   Psychosocial barriers to participate in program The patient should benefit  from training in stress management and relaxation.;There are no identifiable barriers or psychosocial needs.      Screening Interventions   Interventions Provide feedback about the scores to participant;To provide support and resources with identified psychosocial needs;Encouraged to exercise    Expected Outcomes Short Term goal: Utilizing psychosocial counselor, staff and physician to assist with identification of specific Stressors or current issues interfering with healing process. Setting desired goal for each stressor or current issue identified.;Long Term Goal: Stressors or current issues are controlled or eliminated.;Short Term goal: Identification and review with participant of any Quality of Life or Depression concerns found by scoring the questionnaire.;Long Term goal: The participant improves quality of Life and PHQ9 Scores as seen by post scores and/or verbalization of changes          Quality of Life Scores:   Scores of 19 and below usually indicate a poorer quality of life in these areas.  A difference of  2-3 points is a clinically meaningful difference.  A difference of 2-3 points in the total score of the  Quality of Life Index has been associated with significant improvement in overall quality of life, self-image, physical symptoms, and general health in studies assessing change in quality of life.  PHQ-9: Review Flowsheet       11/02/2023  Depression screen PHQ 2/9  Decreased Interest 0  Down, Depressed, Hopeless 0  PHQ - 2 Score 0  Altered sleeping 0  Tired, decreased energy 1  Change in appetite 0  Feeling bad or failure about yourself  0  Trouble concentrating 0  Moving slowly or fidgety/restless 0  Suicidal thoughts 0  PHQ-9 Score 1  Difficult doing work/chores Not difficult at all   Interpretation of Total Score  Total Score Depression Severity:  1-4 = Minimal depression, 5-9 = Mild depression, 10-14 = Moderate depression, 15-19 = Moderately severe depression,  20-27 = Severe depression   Psychosocial Evaluation and Intervention:  Psychosocial Evaluation - 10/27/23 1431       Psychosocial Evaluation & Interventions   Interventions Encouraged to exercise with the program and follow exercise prescription;Relaxation education;Stress management education    Comments Her depression started with family seperation and the climate that we live in right now. She can look to her daughter, friend and therapis for support.    Expected Outcomes Short: Start [facility] to help with mood. Long: Maintain a healthy mental state    Continue Psychosocial Services  Follow up required by staff          Psychosocial Re-Evaluation:  Psychosocial Re-Evaluation     Row Name 12/13/23 1727             Psychosocial Re-Evaluation   Current issues with History of Depression;Current Depression       Comments Jovi states that her sleep, stress, and mental health have been steady with no major changes. She states that she feels like she has the resources and support system to deal with these things in her life. She was advised that if she needed any further resources that staff could provide her with information.       Expected Outcomes Short: continue to attend cardiac rehab for mental health benefits of exercise. Long: maintain good mental health routine.       Interventions Encouraged to attend Cardiac Rehabilitation for the exercise       Continue Psychosocial Services  Follow up required by staff          Psychosocial Discharge (Final Psychosocial Re-Evaluation):  Psychosocial Re-Evaluation - 12/13/23 1727       Psychosocial Re-Evaluation   Current issues with History of Depression;Current Depression    Comments Shaquayla states that her sleep, stress, and mental health have been steady with no major changes. She states that she feels like she has the resources and support system to deal with these things in her life. She was advised that if she needed any further  resources that staff could provide her with information.    Expected Outcomes Short: continue to attend cardiac rehab for mental health benefits of exercise. Long: maintain good mental health routine.    Interventions Encouraged to attend Cardiac Rehabilitation for the exercise    Continue Psychosocial Services  Follow up required by staff          Vocational Rehabilitation: Provide vocational rehab assistance to qualifying candidates.   Vocational Rehab Evaluation & Intervention:   Education: Education Goals: Education classes will be provided on a variety of topics geared toward better understanding of heart health and risk factor modification. Participant will state  understanding/return demonstration of topics presented as noted by education test scores.  Learning Barriers/Preferences:  Learning Barriers/Preferences - 10/27/23 1427       Learning Barriers/Preferences   Learning Barriers None    Learning Preferences None          General Cardiac Education Topics:  AED/CPR: - Group verbal and written instruction with the use of models to demonstrate the basic use of the AED with the basic ABC's of resuscitation.   Test and Procedures: - Group verbal and visual presentation and models provide information about basic cardiac anatomy and function. Reviews the testing methods done to diagnose heart disease and the outcomes of the test results. Describes the treatment choices: Medical Management, Angioplasty, or Coronary Bypass Surgery for treating various heart conditions including Myocardial Infarction, Angina, Valve Disease, and Cardiac Arrhythmias. Written material provided at class time.   Medication Safety: - Group verbal and visual instruction to review commonly prescribed medications for heart and lung disease. Reviews the medication, class of the drug, and side effects. Includes the steps to properly store meds and maintain the prescription regimen. Written material  provided at class time.   Intimacy: - Group verbal instruction through game format to discuss how heart and lung disease can affect sexual intimacy. Written material provided at class time.   Know Your Numbers and Heart Failure: - Group verbal and visual instruction to discuss disease risk factors for cardiac and pulmonary disease and treatment options.  Reviews associated critical values for Overweight/Obesity, Hypertension, Cholesterol, and Diabetes.  Discusses basics of heart failure: signs/symptoms and treatments.  Introduces Heart Failure Zone chart for action plan for heart failure. Written material provided at class time.   Infection Prevention: - Provides verbal and written material to individual with discussion of infection control including proper hand washing and proper equipment cleaning during exercise session. Flowsheet Row Cardiac Rehab from 11/02/2023 in [facility] Cardiac and Pulmonary Rehab  Date 11/02/23  Educator [facility]  Instruction Review Code 1- Verbalizes Understanding    Falls Prevention: - Provides verbal and written material to individual with discussion of falls prevention and safety. Flowsheet Row Cardiac Rehab from 11/02/2023 in [facility] Cardiac and Pulmonary Rehab  Date 11/02/23  Educator [facility]  Instruction Review Code 1- Verbalizes Understanding    Other: -Provides group and verbal instruction on various topics (see comments)   Knowledge Questionnaire Score:   Core Components/Risk Factors/Patient Goals at Admission:  Personal Goals and Risk Factors at Admission - 10/27/23 1426       Core Components/Risk Factors/Patient Goals on Admission    Weight Management Yes;Weight Loss    Intervention Weight Management: Develop a combined nutrition and exercise program designed to reach desired caloric intake, while maintaining appropriate intake of nutrient and fiber, sodium and fats, and appropriate energy expenditure required for the weight goal.;Weight Management: Provide  education and appropriate resources to help participant work on and attain dietary goals.;Weight Management/Obesity: Establish reasonable short term and long term weight goals.    Expected Outcomes Short Term: Continue to assess and modify interventions until short term weight is achieved;Weight Loss: Understanding of general recommendations for a balanced deficit meal plan, which promotes 1-2 lb weight loss per week and includes a negative energy balance of [phone] kcal/d;Understanding recommendations for meals to include 15-35% energy as protein, 25-35% energy from fat, 35-60% energy from carbohydrates, less than 200mg  of dietary cholesterol, 20-35 gm of total fiber daily;Understanding of distribution of calorie intake throughout the day with the consumption of 4-5 meals/snacks  Hypertension Yes    Intervention Provide education on lifestyle modifcations including regular physical activity/exercise, weight management, moderate sodium restriction and increased consumption of fresh fruit, vegetables, and low fat dairy, alcohol moderation, and smoking cessation.;Monitor prescription use compliance.    Expected Outcomes Short Term: Continued assessment and intervention until BP is < 140/39mm HG in hypertensive participants. < 130/29mm HG in hypertensive participants with diabetes, heart failure or chronic kidney disease.;Long Term: Maintenance of blood pressure at goal levels.          Education:Diabetes - Individual verbal and written instruction to review signs/symptoms of diabetes, desired ranges of glucose level fasting, after meals and with exercise. Acknowledge that pre and post exercise glucose checks will be done for 3 sessions at entry of program.   Core Components/Risk Factors/Patient Goals Review:   Goals and Risk Factor Review     Row Name 12/13/23 1725             Core Components/Risk Factors/Patient Goals Review   Personal Goals Review Weight Management/Obesity;Hypertension        Review Aleicia states that she has not been checking BP at home because her blood pressure cuff had dead batteries. She states that she has recently ordered batteries and plans to start checking BP at home once she gets them. Shakemia states that she has gained some weight from when she was sick but feels that a lot of that is muscle as she states she is feeling stronger and feels like she has more musculare strength. More recently her weight has been steady, which she is happy with.       Expected Outcomes Short: get BP cuff working at home and start checking BP at home. Long: control cardiac risk factors.          Core Components/Risk Factors/Patient Goals at Discharge (Final Review):   Goals and Risk Factor Review - 12/13/23 1725       Core Components/Risk Factors/Patient Goals Review   Personal Goals Review Weight Management/Obesity;Hypertension    Review Yashika states that she has not been checking BP at home because her blood pressure cuff had dead batteries. She states that she has recently ordered batteries and plans to start checking BP at home once she gets them. Harlei states that she has gained some weight from when she was sick but feels that a lot of that is muscle as she states she is feeling stronger and feels like she has more musculare strength. More recently her weight has been steady, which she is happy with.    Expected Outcomes Short: get BP cuff working at home and start checking BP at home. Long: control cardiac risk factors.          ITP Comments:  ITP Comments     Row Name 10/27/23 1428 11/02/23 1508 11/08/23 1718 11/24/23 0742 12/22/23 0801   ITP Comments Virtual Visit completed. Patient informed on EP and RD appointment and 6 Minute walk test. Patient also informed of patient health questionnaires on My Chart. Patient Verbalizes understanding. Visit diagnosis can be found in [facility] 10/15/2023. Completed and gym orientation for cardiac rehab. Initial ITP created and sent for  review to Dr. Oneil Pinal, Medical Director. First full day of exercise!  Patient was oriented to gym and equipment including functions, settings, policies, and procedures.  Patient's individual exercise prescription and treatment plan were reviewed.  All starting workloads were established based on the results of the 6 minute walk test done at initial orientation  visit.  The plan for exercise progression was also introduced and progression will be customized based on patient's performance and goals. 30 Day review completed. Medical Director ITP review done, changes made as directed, and signed approval by Medical Director. New to program. 30 Day review completed. Medical Director ITP review done; changes made as directed and signed approval by Medical Director.      Comments: 30 day review

## 2023-12-23 ENCOUNTER — Encounter

## 2023-12-29 ENCOUNTER — Encounter

## 2023-12-30 ENCOUNTER — Encounter

## 2024-01-03 ENCOUNTER — Encounter

## 2024-01-05 ENCOUNTER — Encounter: Attending: Internal Medicine

## 2024-01-05 DIAGNOSIS — I214 Non-ST elevation (NSTEMI) myocardial infarction: Secondary | ICD-10-CM

## 2024-01-05 DIAGNOSIS — Z5189 Encounter for other specified aftercare: Secondary | ICD-10-CM | POA: Diagnosis not present

## 2024-01-05 DIAGNOSIS — I252 Old myocardial infarction: Secondary | ICD-10-CM | POA: Diagnosis present

## 2024-01-05 NOTE — Progress Notes (Signed)
 Daily Session Note  Patient Details  Name: Angel Douglas MRN: [id number] Date of Birth: [birth year] Referring Provider:   Flowsheet Row Cardiac Rehab from 11/02/2023 in [facility] Cardiac and Pulmonary Rehab  Referring Provider Dr. Cara Lovelace    Encounter Date: 01/05/2024  Check In:  Session Check In - 01/05/24 1724       Check-In   Supervising physician immediately available to respond to emergencies See telemetry face sheet for immediately available ER MD    Location [facility]-Cardiac & Pulmonary Rehab    Staff Present Burnard Davenport RN,BSN,MPA;Joseph [facility] Dyane BS, ACSM CEP, Exercise Physiologist    Virtual Visit No    Medication changes reported     No    Fall or balance concerns reported    No    Tobacco Cessation No Change    Warm-up and Cool-down Performed on first and last piece of equipment    Resistance Training Performed Yes    VAD Patient? No    PAD/SET Patient? No      Pain Assessment   Currently in Pain? No/denies             Social History   Tobacco Use  Smoking Status Former   Types: Cigarettes   Start date: 10/27/1998   Quit date: 10/27/1983   Years since quitting: 40.2  Smokeless Tobacco Never  Tobacco Comments   Quit 25ya, 15 pack year    Goals Met:  Independence with exercise equipment Exercise tolerated well No report of concerns or symptoms today Strength training completed today  Goals Unmet:  Not Applicable  Comments: Pt able to follow exercise prescription today without complaint.  Will continue to monitor for progression.    Dr. Oneil Pinal is Medical Director for [facility] Cardiac Rehabilitation.  Dr. Fuad Aleskerov is Medical Director for [facility] Pulmonary Rehabilitation.

## 2024-01-06 ENCOUNTER — Encounter: Admitting: Emergency Medicine

## 2024-01-06 DIAGNOSIS — I214 Non-ST elevation (NSTEMI) myocardial infarction: Secondary | ICD-10-CM

## 2024-01-06 DIAGNOSIS — I252 Old myocardial infarction: Secondary | ICD-10-CM | POA: Diagnosis not present

## 2024-01-06 NOTE — Progress Notes (Signed)
 Daily Session Note  Patient Details  Name: Angel Douglas MRN: [id number] Date of Birth: Jun 20, 1960 Referring Provider:   Flowsheet Row Cardiac Rehab from 11/02/2023 in [facility] Cardiac and Pulmonary Rehab  Referring Provider Dr. Cara Lovelace    Encounter Date: 01/06/2024  Check In:  Session Check In - 01/06/24 1715       Check-In   Supervising physician immediately available to respond to emergencies See telemetry face sheet for immediately available ER MD    Location [facility]-Cardiac & Pulmonary Rehab    Staff Present Rollene Paterson, MS, Exercise Physiologist;Joseph Rolinda RCP,RRT,BSRT;Carvel Huskins RN,BSN    Virtual Visit No    Medication changes reported     No    Fall or balance concerns reported    No    Tobacco Cessation No Change    Warm-up and Cool-down Performed on first and last piece of equipment    Resistance Training Performed Yes    VAD Patient? No    PAD/SET Patient? No      Pain Assessment   Currently in Pain? No/denies             Social History   Tobacco Use  Smoking Status Former   Types: Cigarettes   Start date: 10/27/1998   Quit date: 10/27/1983   Years since quitting: 40.2  Smokeless Tobacco Never  Tobacco Comments   Quit 25ya, 15 pack year    Goals Met:  Independence with exercise equipment Exercise tolerated well No report of concerns or symptoms today Strength training completed today  Goals Unmet:  Not Applicable  Comments: Pt able to follow exercise prescription today without complaint.  Will continue to monitor for progression.    Dr. Oneil Pinal is Medical Director for [facility] Cardiac Rehabilitation.  Dr. Fuad Aleskerov is Medical Director for [facility] Pulmonary Rehabilitation.

## 2024-01-10 ENCOUNTER — Encounter: Admitting: Emergency Medicine

## 2024-01-10 DIAGNOSIS — I252 Old myocardial infarction: Secondary | ICD-10-CM | POA: Diagnosis not present

## 2024-01-10 DIAGNOSIS — I214 Non-ST elevation (NSTEMI) myocardial infarction: Secondary | ICD-10-CM

## 2024-01-10 NOTE — Progress Notes (Signed)
 Daily Session Note  Patient Details  Name: Angel Douglas MRN: [id number] Date of Birth: April 30, 1960 Referring Provider:   Flowsheet Row Cardiac Rehab from 11/02/2023 in [facility] Cardiac and Pulmonary Rehab  Referring Provider Dr. Cara Lovelace    Encounter Date: 01/10/2024  Check In:  Session Check In - 01/10/24 1723       Check-In   Supervising physician immediately available to respond to emergencies See telemetry face sheet for immediately available ER MD    Location [facility]-Cardiac & Pulmonary Rehab    Staff Present Burnard Hint BS, ACSM CEP, Exercise Physiologist;Tema Alire RN,BSN;Joseph Rolinda RCP,RRT,BSRT    Virtual Visit No    Medication changes reported     No    Fall or balance concerns reported    No    Tobacco Cessation No Change    Warm-up and Cool-down Performed on first and last piece of equipment    Resistance Training Performed Yes    VAD Patient? No    PAD/SET Patient? No      Pain Assessment   Currently in Pain? No/denies             Social History   Tobacco Use  Smoking Status Former   Types: Cigarettes   Start date: 10/27/1998   Quit date: 10/27/1983   Years since quitting: 40.2  Smokeless Tobacco Never  Tobacco Comments   Quit 25ya, 15 pack year    Goals Met:  Independence with exercise equipment Exercise tolerated well No report of concerns or symptoms today Strength training completed today  Goals Unmet:  Not Applicable  Comments: Pt able to follow exercise prescription today without complaint.  Will continue to monitor for progression.    Dr. Oneil Pinal is Medical Director for [facility] Cardiac Rehabilitation.  Dr. Fuad Aleskerov is Medical Director for [facility] Pulmonary Rehabilitation.

## 2024-01-12 ENCOUNTER — Encounter

## 2024-01-13 ENCOUNTER — Encounter

## 2024-01-17 ENCOUNTER — Encounter: Admitting: Emergency Medicine

## 2024-01-17 DIAGNOSIS — I214 Non-ST elevation (NSTEMI) myocardial infarction: Secondary | ICD-10-CM

## 2024-01-17 DIAGNOSIS — I252 Old myocardial infarction: Secondary | ICD-10-CM | POA: Diagnosis not present

## 2024-01-17 NOTE — Progress Notes (Signed)
 Daily Session Note  Patient Details  Name: Angel Douglas MRN: [id number] Date of Birth: May 02, 1960 Referring Provider:   Flowsheet Row Cardiac Rehab from 11/02/2023 in [facility] Cardiac and Pulmonary Rehab  Referring Provider Dr. Cara Lovelace    Encounter Date: 01/17/2024  Check In:  Session Check In - 01/17/24 1730       Check-In   Supervising physician immediately available to respond to emergencies See telemetry face sheet for immediately available ER MD    Location [facility]-Cardiac & Pulmonary Rehab    Staff Present Burnard Hint BS, ACSM CEP, Exercise Physiologist;Yuvaan Olander Vita RN,BSN;Joseph Rolinda RCP,RRT,BSRT;Meredith Tressa RN,BSN    Virtual Visit No    Medication changes reported     No    Fall or balance concerns reported    No    Tobacco Cessation No Change    Warm-up and Cool-down Performed on first and last piece of equipment    Resistance Training Performed Yes    VAD Patient? No    PAD/SET Patient? No      Pain Assessment   Currently in Pain? No/denies             Social History   Tobacco Use  Smoking Status Former   Types: Cigarettes   Start date: 10/27/1998   Quit date: 10/27/1983   Years since quitting: 40.2  Smokeless Tobacco Never  Tobacco Comments   Quit 25ya, 15 pack year    Goals Met:  Independence with exercise equipment Exercise tolerated well No report of concerns or symptoms today Strength training completed today  Goals Unmet:  Not Applicable  Comments: Pt able to follow exercise prescription today without complaint.  Will continue to monitor for progression.    Dr. Oneil Pinal is Medical Director for [facility] Cardiac Rehabilitation.  Dr. Fuad Aleskerov is Medical Director for [facility] Pulmonary Rehabilitation.

## 2024-01-19 ENCOUNTER — Encounter: Admitting: Emergency Medicine

## 2024-01-19 DIAGNOSIS — I214 Non-ST elevation (NSTEMI) myocardial infarction: Secondary | ICD-10-CM

## 2024-01-19 DIAGNOSIS — I252 Old myocardial infarction: Secondary | ICD-10-CM | POA: Diagnosis not present

## 2024-01-19 NOTE — Progress Notes (Signed)
 Cardiac Individual Treatment Plan  Patient Details  Name: Shakina Choy MRN: [id number] Date of Birth: 1960/07/19 Referring Provider:   Flowsheet Row Cardiac Rehab from 11/02/2023 in [facility] Cardiac and Pulmonary Rehab  Referring Provider Dr. Cara Lovelace    Initial Encounter Date:  Flowsheet Row Cardiac Rehab from 11/02/2023 in [facility] Cardiac and Pulmonary Rehab  Date 11/02/23    Visit Diagnosis: NSTEMI (non-ST elevated myocardial infarction) [facility])  Patient's Home Medications on Admission:  Current Outpatient Medications:    acetaminophen  (TYLENOL ) 325 MG tablet, Take 2 tablets (650 mg total) by mouth every 6 (six) hours as needed for mild pain (or Fever >/= 101)., Disp: 20 tablet, Rfl: 0   ALLEGRA-D ALLERGY & CONGESTION 180-240 MG 24 hr tablet, Take 1 tablet by mouth daily., Disp: , Rfl:    aspirin  EC 81 MG tablet, Take 1 tablet (81 mg total) by mouth daily. Swallow whole., Disp: 30 tablet, Rfl: 11   buPROPion (WELLBUTRIN XL) 300 MG 24 hr tablet, Take 300 mg by mouth daily., Disp: , Rfl:    estradiol (ESTRACE) 0.1 MG/GM vaginal cream, Place vaginally., Disp: , Rfl:    isosorbide  mononitrate (IMDUR ) 30 MG 24 hr tablet, Take 1 tablet (30 mg total) by mouth daily. (Patient not taking: Reported on 10/27/2023), Disp: 30 tablet, Rfl: 11   metoprolol  succinate (TOPROL -XL) 25 MG 24 hr tablet, Take 0.5 tablets (12.5 mg total) by mouth daily. (Patient not taking: Reported on 10/27/2023), Disp: 15 tablet, Rfl: 11   MOUNJARO 15 MG/0.5ML Pen, , Disp: , Rfl:    Multiple Vitamin (MULTI-VITAMINS) TABS, Take by mouth., Disp: , Rfl:    rosuvastatin  (CRESTOR ) 5 MG tablet, Take 1 tablet (5 mg total) by mouth daily., Disp: 30 tablet, Rfl: 11  Past Medical History: Past Medical History:  Diagnosis Date   Allergy    Depression    Hypertension    Preeclampsia     Tobacco Use: Social History   Tobacco Use  Smoking Status Former   Types: Cigarettes   Start date: 10/27/1998   Quit date: 10/27/1983    Years since quitting: 40.2  Smokeless Tobacco Never  Tobacco Comments   Quit 25ya, 15 pack year    Labs: Review Flowsheet       Latest Ref Rng & Units 10/16/2023  Labs for ITP Cardiac and Pulmonary Rehab  Cholestrol 0 - 200 mg/dL 845   LDL (calc) 0 - 99 mg/dL 92   HDL-C >59 mg/dL 54   Trlycerides <849 mg/dL 42      Exercise Target Goals: Exercise Program Goal: Individual exercise prescription set using results from initial 6 min walk test and THRR while considering  patient's activity barriers and safety.   Exercise Prescription Goal: Initial exercise prescription builds to 30-45 minutes a day of aerobic activity, 2-3 days per week.  Home exercise guidelines will be given to patient during program as part of exercise prescription that the participant will acknowledge.   Education: Aerobic Exercise: - Group verbal and visual presentation on the components of exercise prescription. Introduces F.I.T.T principle from ACSM for exercise prescriptions.  Reviews F.I.T.T. principles of aerobic exercise including progression. Written material provided at class time.   Education: Resistance Exercise: - Group verbal and visual presentation on the components of exercise prescription. Introduces F.I.T.T principle from ACSM for exercise prescriptions  Reviews F.I.T.T. principles of resistance exercise including progression. Written material provided at class time.    Education: Exercise & Equipment Safety: - Individual verbal instruction and demonstration of  equipment use and safety with use of the equipment. Flowsheet Row Cardiac Rehab from 11/02/2023 in [facility] Cardiac and Pulmonary Rehab  Date 11/02/23  Educator [facility]  Instruction Review Code 1- Verbalizes Understanding    Education: Exercise Physiology & General Exercise Guidelines: - Group verbal and written instruction with models to review the exercise physiology of the cardiovascular system and associated critical values. Provides general  exercise guidelines with specific guidelines to those with heart or lung disease. Written material provided at class time.   Education: Flexibility, Balance, Mind/Body Relaxation: - Group verbal and visual presentation with interactive activity on the components of exercise prescription. Introduces F.I.T.T principle from ACSM for exercise prescriptions. Reviews F.I.T.T. principles of flexibility and balance exercise training including progression. Also discusses the mind body connection.  Reviews various relaxation techniques to help reduce and manage stress (i.e. Deep breathing, progressive muscle relaxation, and visualization). Balance handout provided to take home. Written material provided at class time.   Activity Barriers & Risk Stratification:  Activity Barriers & Cardiac Risk Stratification - 11/02/23 1510       Activity Barriers & Cardiac Risk Stratification   Activity Barriers None    Cardiac Risk Stratification Moderate          6 Minute Walk:  6 Minute Walk     Row Name 11/02/23 1509         6 Minute Walk   Phase Initial     Distance 1440 feet     Walk Time 6 minutes     # of Rest Breaks 0     MPH 2.73     METS 3.43     RPE 7     Perceived Dyspnea  0     VO2 Peak 12     Symptoms No     Resting HR 64 bpm     Resting BP 106/64     Resting Oxygen Saturation  96 %     Exercise Oxygen Saturation  during 6 min walk 96 %     Max Ex. HR 101 bpm     Max Ex. BP 124/66     2 Minute Post BP 114/60        Oxygen Initial Assessment:   Oxygen Re-Evaluation:   Oxygen Discharge (Final Oxygen Re-Evaluation):   Initial Exercise Prescription:  Initial Exercise Prescription - 11/02/23 1500       Date of Initial Exercise RX and Referring Provider   Date 11/02/23    Referring Provider Dr. Cara Lovelace      Oxygen   Maintain Oxygen Saturation 88% or higher      Treadmill   MPH 2.7    Grade 0    Minutes 15    METs 3.07      NuStep   Level 2    SPM 80     Minutes 15    METs 3.43      REL-XR   Level 2    Watts 25    Speed 50    Minutes 15    METs 3.43      T5 Nustep   Level 2   T6   SPM 80    Minutes 15    METs 3.43      Prescription Details   Duration Progress to 30 minutes of continuous aerobic without signs/symptoms of physical distress      Intensity   THRR 40-80% of Max Heartrate 101-139    Ratings of Perceived Exertion 11-13  Perceived Dyspnea 0-4      Progression   Progression Continue to progress workloads to maintain intensity without signs/symptoms of physical distress.      Resistance Training   Training Prescription Yes    Weight 7lb    Reps 10-15          Perform Capillary Blood Glucose checks as needed.  Exercise Prescription Changes:   Exercise Prescription Changes     Row Name 11/02/23 1500 11/19/23 1000 12/01/23 1500 12/06/23 1700 12/13/23 1600     Response to Exercise   Blood Pressure (Admit) 106/64 108/72 118/58 -- 120/60   Blood Pressure (Exercise) 124/66 140/58 156/82 -- --   Blood Pressure (Exit) 114/66 96/70 118/64 -- 100/62   Heart Rate (Admit) 64 bpm 72 bpm 70 bpm -- 70 bpm   Heart Rate (Exercise) 101 bpm 122 bpm 131 bpm -- 124 bpm   Heart Rate (Exit) 66 bpm 90 bpm 74 bpm -- 88 bpm   Oxygen Saturation (Admit) 96 % -- -- -- --   Oxygen Saturation (Exercise) 96 % -- -- -- --   Oxygen Saturation (Exit) 96 % -- -- -- --   Rating of Perceived Exertion (Exercise) 7 15 15  -- 15   Perceived Dyspnea (Exercise) 0 0 0 -- 0   Symptoms none none none -- none   Comments results first 2 weeks of exercise -- -- --   Duration -- Progress to 30 minutes of  aerobic without signs/symptoms of physical distress Progress to 30 minutes of  aerobic without signs/symptoms of physical distress Progress to 30 minutes of  aerobic without signs/symptoms of physical distress Continue with 30 min of aerobic exercise without signs/symptoms of physical distress.   Intensity -- THRR unchanged THRR unchanged  THRR unchanged THRR unchanged     Progression   Progression -- Continue to progress workloads to maintain intensity without signs/symptoms of physical distress. Continue to progress workloads to maintain intensity without signs/symptoms of physical distress. Continue to progress workloads to maintain intensity without signs/symptoms of physical distress. Continue to progress workloads to maintain intensity without signs/symptoms of physical distress.   Average METs -- 5.2 5.38 5.38 4.94     Resistance Training   Training Prescription -- Yes Yes Yes Yes   Weight -- 7lb 7lb 7lb 3 lb   Reps -- 10-15 10-15 10-15 10-15     Interval Training   Interval Training -- No No No No     Treadmill   MPH -- 3.3 3 3  3.4   Grade -- 5 8 8 7    Minutes -- 15 15 15 15    METs -- 5.8 6.61 6.61 6.88     NuStep   Level -- 2 2 2 4   T6: 4   Minutes -- 15 15 15 15    METs -- 3.1 3.7 3.7 4  T6: 2.5     REL-XR   Level -- 5 10 10 7    Minutes -- 15 15 15 15    METs -- 6.8 7.3 7.3 6     T5 Nustep   Level -- -- 2 2 --   Minutes -- -- 15 15 --   METs -- -- 3 3 --     Rower   Level -- -- 3 3 2    Watts -- -- 41 41 39   Minutes -- -- 15 15 15    METs -- -- 4.37 4.37 5.57     Home Exercise Plan   Plans to continue  exercise at -- -- -- [employer] (comment) [employer] (comment)   Frequency -- -- -- Add 2 additional days to program exercise sessions. Add 2 additional days to program exercise sessions.   Initial Home Exercises Provided -- -- -- 12/06/23 12/06/23     Oxygen   Maintain Oxygen Saturation -- 88% or higher 88% or higher 88% or higher 88% or higher    Row Name 12/29/23 1000 01/11/24 1400           Response to Exercise   Blood Pressure (Admit) 106/62 104/60      Blood Pressure (Exit) 102/62 106/60      Heart Rate (Admit) 60 bpm 80 bpm      Heart Rate (Exercise) 135 bpm 137 bpm      Heart Rate (Exit) 85 bpm 90 bpm      Rating of Perceived Exertion (Exercise) 13 13       Symptoms none none      Duration Continue with 30 min of aerobic exercise without signs/symptoms of physical distress. Continue with 30 min of aerobic exercise without signs/symptoms of physical distress.      Intensity THRR unchanged THRR unchanged        Progression   Progression Continue to progress workloads to maintain intensity without signs/symptoms of physical distress. Continue to progress workloads to maintain intensity without signs/symptoms of physical distress.      Average METs 6.02 5.87        Resistance Training   Training Prescription Yes Yes      Weight 3 lb 3 lb      Reps 10-15 10-15        Interval Training   Interval Training No No        Treadmill   MPH 3.1 3.3      Grade 6 5      Minutes 15 15      METs 5.95 5.8        NuStep   Level 5 --      Minutes 25 --      METs 7.4 --        REL-XR   Level 6 --      Minutes 15 --      METs 5.7 --        Rower   Level 10 6      Watts 61 62      Minutes 15 15      METs 6.67 6.59        Home Exercise Plan   Plans to continue exercise at [employer] (comment) [employer] (comment)      Frequency Add 2 additional days to program exercise sessions. Add 2 additional days to program exercise sessions.      Initial Home Exercises Provided 12/06/23 12/06/23        Oxygen   Maintain Oxygen Saturation 88% or higher 88% or higher         Exercise Comments:   Exercise Comments     Row Name 11/08/23 1719           Exercise Comments First full day of exercise!  Patient was oriented to gym and equipment including functions, settings, policies, and procedures.  Patient's individual exercise prescription and treatment plan were reviewed.  All starting workloads were established based on the results of the 6 minute walk test done at initial orientation visit.  The plan for exercise progression was also introduced and progression will be customized  based on patient's performance and goals.           Exercise Goals and Review:   Exercise Goals     Row Name 11/02/23 1513             Exercise Goals   Increase Physical Activity Yes       Intervention Provide advice, education, support and counseling about physical activity/exercise needs.;Develop an individualized exercise prescription for aerobic and resistive training based on initial evaluation findings, risk stratification, comorbidities and participant's personal goals.       Expected Outcomes Short Term: Attend rehab on a regular basis to increase amount of physical activity.;Long Term: Add in home exercise to make exercise part of routine and to increase amount of physical activity.;Long Term: Exercising regularly at least 3-5 days a week.       Increase Strength and Stamina Yes       Intervention Provide advice, education, support and counseling about physical activity/exercise needs.;Develop an individualized exercise prescription for aerobic and resistive training based on initial evaluation findings, risk stratification, comorbidities and participant's personal goals.       Expected Outcomes Short Term: Increase workloads from initial exercise prescription for resistance, speed, and METs.;Short Term: Perform resistance training exercises routinely during rehab and add in resistance training at home;Long Term: Improve cardiorespiratory fitness, muscular endurance and strength as measured by increased METs and functional capacity ( )       Able to understand and use rate of perceived exertion (RPE) scale Yes       Intervention Provide education and explanation on how to use RPE scale       Expected Outcomes Short Term: Able to use RPE daily in rehab to express subjective intensity level;Long Term:  Able to use RPE to guide intensity level when exercising independently       Able to understand and use Dyspnea scale Yes       Intervention Provide education and explanation on how to use Dyspnea scale       Expected Outcomes Long  Term: Able to use Dyspnea scale to guide intensity level when exercising independently;Short Term: Able to use Dyspnea scale daily in rehab to express subjective sense of shortness of breath during exertion       Knowledge and understanding of Target Heart Rate Range (THRR) Yes       Intervention Provide education and explanation of THRR including how the numbers were predicted and where they are located for reference       Expected Outcomes Short Term: Able to state/look up THRR;Short Term: Able to use daily as guideline for intensity in rehab;Long Term: Able to use THRR to govern intensity when exercising independently       Able to check pulse independently Yes       Intervention Provide education and demonstration on how to check pulse in carotid and radial arteries.;Review the importance of being able to check your own pulse for safety during independent exercise       Expected Outcomes Short Term: Able to explain why pulse checking is important during independent exercise;Long Term: Able to check pulse independently and accurately       Understanding of Exercise Prescription Yes       Intervention Provide education, explanation, and written materials on patient's individual exercise prescription       Expected Outcomes Short Term: Able to explain program exercise prescription;Long Term: Able to explain home exercise prescription to exercise independently  Exercise Goals Re-Evaluation :  Exercise Goals Re-Evaluation     Row Name 11/08/23 1719 11/19/23 1029 12/01/23 1557 12/06/23 1736 12/13/23 1608     Exercise Goal Re-Evaluation   Exercise Goals Review Increase Physical Activity;Knowledge and understanding of Target Heart Rate Range (THRR);Able to understand and use rate of perceived exertion (RPE) scale;Understanding of Exercise Prescription;Increase Strength and Stamina;Able to check pulse independently Increase Physical Activity;Increase Strength and Stamina;Understanding of  Exercise Prescription Increase Physical Activity;Increase Strength and Stamina;Understanding of Exercise Prescription Increase Physical Activity;Increase Strength and Stamina;Able to understand and use rate of perceived exertion (RPE) scale;Able to understand and use Dyspnea scale;Knowledge and understanding of Target Heart Rate Range (THRR);Able to check pulse independently;Understanding of Exercise Prescription Increase Physical Activity;Increase Strength and Stamina;Understanding of Exercise Prescription   Comments Reviewed RPE and dyspnea scale, THR and program prescription with pt today.  Pt voiced understanding and was given a copy of goals to take home. Akili is off to a great start in rehab. She was able to attend her first 3 sessions during this weeks review period. During these sessions she was able to increase from 2.7 to 3. on the treadmill, and increase to level 5 on the XR. We will continue to monitor her progress in the program. Shaunessy continues to do well in rehab. She was recently able to increase her treadmill incline from 5% to 8%, while maintaining a speed of . She was also able to increase from level 5 to level 10 on the XR. We will continue to monitor her progress in the program. Reviewed home exercise with pt today.  Pt plans to go to her community gym for exercise.  Reviewed THR, pulse, RPE, sign and symptoms, pulse oximetery and when to call 911 or MD.  Also discussed weather considerations and indoor options.  Pt voiced understanding. Ladawn continues to do well in rehab. She recently increased her treadmill workload to a speed of 3.4 mph with an incline of 7%. She also improved to level 4 on the T4 nustep. She did decrease from 7 lb to 3 lb handweights for resistance training as well. We will continue to monitor her progress in the program.   Expected Outcomes Short: Use RPE daily to regulate intensity.  Long: Follow program prescription in THR. Short: Continue to follow exercise  prescription. Long: Continue exercise to improve strength and stamina. Short: Continue to increase treadmill workload. Long: Continue exercise to improev strength and stamina. Short: add 1-2 days per week of exercise on off days of cardiac rehab. Long: maintain independent exercise routine upon graduation from cardiac rehab. Short: Increase back up to 7 lb hand weights for resistance training. Long: Continue exercise to increase strength and stamina.    Row Name 12/29/23 1029 01/11/24 1458           Exercise Goal Re-Evaluation   Exercise Goals Review Increase Physical Activity;Increase Strength and Stamina;Understanding of Exercise Prescription Increase Physical Activity;Increase Strength and Stamina;Understanding of Exercise Prescription      Comments Odeth continues to make improvements in rehab. She has been able to increase from level 4 to 5 on the T4 nustep, as well as increase from level 2 to 10 on the rower. We will continue to monitor her progress in the program. Jenille continues to do well in rehab. She recently increased her speed on the treadmill to 3.3 mph while maintaining an incline of 5%. Her workload on the rower also decreased from level 10 to level 6 since the last review.  We will continue to monitor her progress in the program.      Expected Outcomes Short: Continue to increase workload on the rowing machine. Long: Continue exercise to improve strength and stamina. Short: Increase workload on the Rower back up to level 10. Long: Continue exercise to improve strength and stamina.         Discharge Exercise Prescription (Final Exercise Prescription Changes):  Exercise Prescription Changes - 01/11/24 1400       Response to Exercise   Blood Pressure (Admit) 104/60    Blood Pressure (Exit) 106/60    Heart Rate (Admit) 80 bpm    Heart Rate (Exercise) 137 bpm    Heart Rate (Exit) 90 bpm    Rating of Perceived Exertion (Exercise) 13    Symptoms none    Duration Continue with 30 min  of aerobic exercise without signs/symptoms of physical distress.    Intensity THRR unchanged      Progression   Progression Continue to progress workloads to maintain intensity without signs/symptoms of physical distress.    Average METs 5.87      Resistance Training   Training Prescription Yes    Weight 3 lb    Reps 10-15      Interval Training   Interval Training No      Treadmill   MPH 3.3    Grade 5    Minutes 15    METs 5.8      Rower   Level 6    Watts 62    Minutes 15    METs 6.59      Home Exercise Plan   Plans to continue exercise at [employer] (comment)    Frequency Add 2 additional days to program exercise sessions.    Initial Home Exercises Provided 12/06/23      Oxygen   Maintain Oxygen Saturation 88% or higher          Nutrition:  Target Goals: Understanding of nutrition guidelines, daily intake of sodium 1500mg , cholesterol 200mg , calories 30% from fat and 7% or less from saturated fats, daily to have 5 or more servings of fruits and vegetables.  Education: Nutrition 1 -Group instruction provided by verbal, written material, interactive activities, discussions, models, and posters to present general guidelines for heart healthy nutrition including macronutrients, label reading, and promoting whole foods over processed counterparts. Education serves as Pensions consultant of discussion of heart healthy eating for all. Written material provided at class time.    Education: Nutrition 2 -Group instruction provided by verbal, written material, interactive activities, discussions, models, and posters to present general guidelines for heart healthy nutrition including sodium, cholesterol, and saturated fat. Providing guidance of habit forming to improve blood pressure, cholesterol, and body weight. Written material provided at class time.     Biometrics:  Pre Biometrics - 11/02/23 1514       Pre Biometrics   Height 5' (1.524 m)    Weight 137 lb  (62.1 kg)    Waist Circumference 30 inches    Hip Circumference 39 inches    Waist to Hip Ratio 0.77 %    BMI (Calculated) 26.76    Single Leg Stand 22 seconds           Nutrition Therapy Plan and Nutrition Goals:  Nutrition Therapy & Goals - 12/13/23 1724       Nutrition Therapy   RD appointment deferred Yes          Nutrition Assessments:  MEDIFICTS Score Key: >=70 Need  to make dietary changes  40-70 Heart Healthy Diet <= 40 Therapeutic Level Cholesterol Diet   Picture Your Plate Scores: <59 Unhealthy dietary pattern with much room for improvement. 41-50 Dietary pattern unlikely to meet recommendations for good health and room for improvement. 51-60 More healthful dietary pattern, with some room for improvement.  >60 Healthy dietary pattern, although there may be some specific behaviors that could be improved.    Nutrition Goals Re-Evaluation:  Nutrition Goals Re-Evaluation     Row Name 12/13/23 1724 01/06/24 1731           Goals   Comment Continues to defer RD apt. Patient deferred RD appointment.         Nutrition Goals Discharge (Final Nutrition Goals Re-Evaluation):  Nutrition Goals Re-Evaluation - 01/06/24 1731       Goals   Comment Patient deferred RD appointment.          Psychosocial: Target Goals: Acknowledge presence or absence of significant depression and/or stress, maximize coping skills, provide positive support system. Participant is able to verbalize types and ability to use techniques and skills needed for reducing stress and depression.   Education: Stress, Anxiety, and Depression - Group verbal and visual presentation to define topics covered.  Reviews how body is impacted by stress, anxiety, and depression.  Also discusses healthy ways to reduce stress and to treat/manage anxiety and depression. Written material provided at class time.   Education: Sleep Hygiene -Provides group verbal and written instruction about how sleep  can affect your health.  Define sleep hygiene, discuss sleep cycles and impact of sleep habits. Review good sleep hygiene tips.   Initial Review & Psychosocial Screening:  Initial Psych Review & Screening - 10/27/23 1430       Initial Review   Current issues with Current Depression;History of Depression      Family Dynamics   Good Support System? Yes    Comments Her depression started with family seperation and the climate that we live in right now. She can look to her daughter, friend and therapis for support.      Barriers   Psychosocial barriers to participate in program The patient should benefit from training in stress management and relaxation.;There are no identifiable barriers or psychosocial needs.      Screening Interventions   Interventions Provide feedback about the scores to participant;To provide support and resources with identified psychosocial needs;Encouraged to exercise    Expected Outcomes Short Term goal: Utilizing psychosocial counselor, staff and physician to assist with identification of specific Stressors or current issues interfering with healing process. Setting desired goal for each stressor or current issue identified.;Long Term Goal: Stressors or current issues are controlled or eliminated.;Short Term goal: Identification and review with participant of any Quality of Life or Depression concerns found by scoring the questionnaire.;Long Term goal: The participant improves quality of Life and PHQ9 Scores as seen by post scores and/or verbalization of changes          Quality of Life Scores:   Scores of 19 and below usually indicate a poorer quality of life in these areas.  A difference of  2-3 points is a clinically meaningful difference.  A difference of 2-3 points in the total score of the Quality of Life Index has been associated with significant improvement in overall quality of life, self-image, physical symptoms, and general health in studies assessing  change in quality of life.  PHQ-9: Review Flowsheet       11/02/2023  Depression  screen PHQ 2/9  Decreased Interest 0  Down, Depressed, Hopeless 0  PHQ - 2 Score 0  Altered sleeping 0  Tired, decreased energy 1  Change in appetite 0  Feeling bad or failure about yourself  0  Trouble concentrating 0  Moving slowly or fidgety/restless 0  Suicidal thoughts 0  PHQ-9 Score 1  Difficult doing work/chores Not difficult at all   Interpretation of Total Score  Total Score Depression Severity:  1-4 = Minimal depression, 5-9 = Mild depression, 10-14 = Moderate depression, 15-19 = Moderately severe depression, 20-27 = Severe depression   Psychosocial Evaluation and Intervention:  Psychosocial Evaluation - 10/27/23 1431       Psychosocial Evaluation & Interventions   Interventions Encouraged to exercise with the program and follow exercise prescription;Relaxation education;Stress management education    Comments Her depression started with family seperation and the climate that we live in right now. She can look to her daughter, friend and therapis for support.    Expected Outcomes Short: Start [facility] to help with mood. Long: Maintain a healthy mental state    Continue Psychosocial Services  Follow up required by staff          Psychosocial Re-Evaluation:  Psychosocial Re-Evaluation     Row Name 12/13/23 1727 01/06/24 1727           Psychosocial Re-Evaluation   Current issues with History of Depression;Current Depression Current Psychotropic Meds;Current Depression;History of Depression      Comments Luara states that her sleep, stress, and mental health have been steady with no major changes. She states that she feels like she has the resources and support system to deal with these things in her life. She was advised that if she needed any further resources that staff could provide her with information. Carole states that her depression is mostly there and now is a hard time with  whats going on in the world. She does well with exercise and it helps her mood. She went to [place] to her family the last couple.      Expected Outcomes Short: continue to attend cardiac rehab for mental health benefits of exercise. Long: maintain good mental health routine. Short: Continue to exercise regularly to support mental health and notify staff of any changes. Long: maintain mental health and well being through teaching of rehab or prescribed medications independently.      Interventions Encouraged to attend Cardiac Rehabilitation for the exercise Encouraged to attend Cardiac Rehabilitation for the exercise      Continue Psychosocial Services  Follow up required by staff Follow up required by staff         Psychosocial Discharge (Final Psychosocial Re-Evaluation):  Psychosocial Re-Evaluation - 01/06/24 1727       Psychosocial Re-Evaluation   Current issues with Current Psychotropic Meds;Current Depression;History of Depression    Comments Zulay states that her depression is mostly there and now is a hard time with whats going on in the world. She does well with exercise and it helps her mood. She went to [place] to her family the last couple.    Expected Outcomes Short: Continue to exercise regularly to support mental health and notify staff of any changes. Long: maintain mental health and well being through teaching of rehab or prescribed medications independently.    Interventions Encouraged to attend Cardiac Rehabilitation for the exercise    Continue Psychosocial Services  Follow up required by staff          Vocational Rehabilitation:  Provide vocational rehab assistance to qualifying candidates.   Vocational Rehab Evaluation & Intervention:   Education: Education Goals: Education classes will be provided on a variety of topics geared toward better understanding of heart health and risk factor modification. Participant will state understanding/return demonstration of topics  presented as noted by education test scores.  Learning Barriers/Preferences:  Learning Barriers/Preferences - 10/27/23 1427       Learning Barriers/Preferences   Learning Barriers None    Learning Preferences None          General Cardiac Education Topics:  AED/CPR: - Group verbal and written instruction with the use of models to demonstrate the basic use of the AED with the basic ABC's of resuscitation.   Test and Procedures: - Group verbal and visual presentation and models provide information about basic cardiac anatomy and function. Reviews the testing methods done to diagnose heart disease and the outcomes of the test results. Describes the treatment choices: Medical Management, Angioplasty, or Coronary Bypass Surgery for treating various heart conditions including Myocardial Infarction, Angina, Valve Disease, and Cardiac Arrhythmias. Written material provided at class time.   Medication Safety: - Group verbal and visual instruction to review commonly prescribed medications for heart and lung disease. Reviews the medication, class of the drug, and side effects. Includes the steps to properly store meds and maintain the prescription regimen. Written material provided at class time.   Intimacy: - Group verbal instruction through game format to discuss how heart and lung disease can affect sexual intimacy. Written material provided at class time.   Know Your Numbers and Heart Failure: - Group verbal and visual instruction to discuss disease risk factors for cardiac and pulmonary disease and treatment options.  Reviews associated critical values for Overweight/Obesity, Hypertension, Cholesterol, and Diabetes.  Discusses basics of heart failure: signs/symptoms and treatments.  Introduces Heart Failure Zone chart for action plan for heart failure. Written material provided at class time.   Infection Prevention: - Provides verbal and written material to individual with discussion  of infection control including proper hand washing and proper equipment cleaning during exercise session. Flowsheet Row Cardiac Rehab from 11/02/2023 in [facility] Cardiac and Pulmonary Rehab  Date 11/02/23  Educator [facility]  Instruction Review Code 1- Verbalizes Understanding    Falls Prevention: - Provides verbal and written material to individual with discussion of falls prevention and safety. Flowsheet Row Cardiac Rehab from 11/02/2023 in [facility] Cardiac and Pulmonary Rehab  Date 11/02/23  Educator [facility]  Instruction Review Code 1- Verbalizes Understanding    Other: -Provides group and verbal instruction on various topics (see comments)   Knowledge Questionnaire Score:   Core Components/Risk Factors/Patient Goals at Admission:  Personal Goals and Risk Factors at Admission - 10/27/23 1426       Core Components/Risk Factors/Patient Goals on Admission    Weight Management Yes;Weight Loss    Intervention Weight Management: Develop a combined nutrition and exercise program designed to reach desired caloric intake, while maintaining appropriate intake of nutrient and fiber, sodium and fats, and appropriate energy expenditure required for the weight goal.;Weight Management: Provide education and appropriate resources to help participant work on and attain dietary goals.;Weight Management/Obesity: Establish reasonable short term and long term weight goals.    Expected Outcomes Short Term: Continue to assess and modify interventions until short term weight is achieved;Weight Loss: Understanding of general recommendations for a balanced deficit meal plan, which promotes 1-2 lb weight loss per week and includes a negative energy balance of [phone] kcal/d;Understanding recommendations for  meals to include 15-35% energy as protein, 25-35% energy from fat, 35-60% energy from carbohydrates, less than 200mg  of dietary cholesterol, 20-35 gm of total fiber daily;Understanding of distribution of calorie intake throughout  the day with the consumption of 4-5 meals/snacks    Hypertension Yes    Intervention Provide education on lifestyle modifcations including regular physical activity/exercise, weight management, moderate sodium restriction and increased consumption of fresh fruit, vegetables, and low fat dairy, alcohol moderation, and smoking cessation.;Monitor prescription use compliance.    Expected Outcomes Short Term: Continued assessment and intervention until BP is < 140/41mm HG in hypertensive participants. < 130/72mm HG in hypertensive participants with diabetes, heart failure or chronic kidney disease.;Long Term: Maintenance of blood pressure at goal levels.          Education:Diabetes - Individual verbal and written instruction to review signs/symptoms of diabetes, desired ranges of glucose level fasting, after meals and with exercise. Acknowledge that pre and post exercise glucose checks will be done for 3 sessions at entry of program.   Core Components/Risk Factors/Patient Goals Review:   Goals and Risk Factor Review     Row Name 12/13/23 1725 01/06/24 1732           Core Components/Risk Factors/Patient Goals Review   Personal Goals Review Weight Management/Obesity;Hypertension Other;Weight Management/Obesity      Review Armine states that she has not been checking BP at home because her blood pressure cuff had dead batteries. She states that she has recently ordered batteries and plans to start checking BP at home once she gets them. Nyara states that she has gained some weight from when she was sick but feels that a lot of that is muscle as she states she is feeling stronger and feels like she has more musculare strength. More recently her weight has been steady, which she is happy with. Benedetta was out for two weeks on vacation and came back without weight gain. Improving Strength and stamina is one of the most important things to her. Informed her that she is doing well and increasing her loads on all  her machines.      Expected Outcomes Short: get BP cuff working at home and start checking BP at home. Long: control cardiac risk factors. Short: improve strength and stamina. Long: continue to workout independently post HeartTreck.         Core Components/Risk Factors/Patient Goals at Discharge (Final Review):   Goals and Risk Factor Review - 01/06/24 1732       Core Components/Risk Factors/Patient Goals Review   Personal Goals Review Other;Weight Management/Obesity    Review Latorie was out for two weeks on vacation and came back without weight gain. Improving Strength and stamina is one of the most important things to her. Informed her that she is doing well and increasing her loads on all her machines.    Expected Outcomes Short: improve strength and stamina. Long: continue to workout independently post HeartTreck.          ITP Comments:  ITP Comments     Row Name 10/27/23 1428 11/02/23 1508 11/08/23 1718 11/24/23 0742 12/22/23 0801   ITP Comments Virtual Visit completed. Patient informed on EP and RD appointment and 6 Minute walk test. Patient also informed of patient health questionnaires on My Chart. Patient Verbalizes understanding. Visit diagnosis can be found in [facility] 10/15/2023. Completed and gym orientation for cardiac rehab. Initial ITP created and sent for review to Dr. Oneil Pinal, Medical Director. First full day of  exercise!  Patient was oriented to gym and equipment including functions, settings, policies, and procedures.  Patient's individual exercise prescription and treatment plan were reviewed.  All starting workloads were established based on the results of the 6 minute walk test done at initial orientation visit.  The plan for exercise progression was also introduced and progression will be customized based on patient's performance and goals. 30 Day review completed. Medical Director ITP review done, changes made as directed, and signed approval by Medical Director. New  to program. 30 Day review completed. Medical Director ITP review done; changes made as directed and signed approval by Medical Director.    Row Name 01/19/24 1000           ITP Comments 30 Day review completed. Medical Director ITP review done; changes made as directed and signed approval by Medical Director.          Comments: 30 day review

## 2024-01-19 NOTE — Progress Notes (Signed)
 Daily Session Note  Patient Details  Name: Angel Douglas MRN: [id number] Date of Birth: [birth year] Referring Provider:   Flowsheet Row Cardiac Rehab from 11/02/2023 in [facility] Cardiac and Pulmonary Rehab  Referring Provider Dr. Cara Lovelace    Encounter Date: 01/19/2024  Check In:  Session Check In - 01/19/24 1727       Check-In   Supervising physician immediately available to respond to emergencies See telemetry face sheet for immediately available ER MD    Location [facility]-Cardiac & Pulmonary Rehab    Staff Present Burnard Hint BS, ACSM CEP, Exercise Physiologist;Jenascia Bumpass RN,BSN;Joseph Rolinda RCP,RRT,BSRT    Virtual Visit No    Medication changes reported     No    Fall or balance concerns reported    No    Tobacco Cessation No Change    Warm-up and Cool-down Performed on first and last piece of equipment    Resistance Training Performed Yes    VAD Patient? No    PAD/SET Patient? No      Pain Assessment   Currently in Pain? No/denies             Social History   Tobacco Use  Smoking Status Former   Types: Cigarettes   Start date: 10/27/1998   Quit date: 10/27/1983   Years since quitting: 40.2  Smokeless Tobacco Never  Tobacco Comments   Quit 25ya, 15 pack year    Goals Met:  Independence with exercise equipment Exercise tolerated well No report of concerns or symptoms today Strength training completed today  Goals Unmet:  Not Applicable  Comments: Pt able to follow exercise prescription today without complaint.  Will continue to monitor for progression.    Dr. Oneil Pinal is Medical Director for [facility] Cardiac Rehabilitation.  Dr. Fuad Aleskerov is Medical Director for [facility] Pulmonary Rehabilitation.

## 2024-01-20 ENCOUNTER — Encounter: Admitting: Emergency Medicine

## 2024-01-20 DIAGNOSIS — I214 Non-ST elevation (NSTEMI) myocardial infarction: Secondary | ICD-10-CM

## 2024-01-20 DIAGNOSIS — I252 Old myocardial infarction: Secondary | ICD-10-CM | POA: Diagnosis not present

## 2024-01-20 NOTE — Progress Notes (Signed)
 Daily Session Note  Patient Details  Name: Angel Douglas MRN: [id number] Date of Birth: 1960-11-11 Referring Provider:   Flowsheet Row Cardiac Rehab from 11/02/2023 in [facility] Cardiac and Pulmonary Rehab  Referring Provider Dr. Cara Lovelace    Encounter Date: 01/20/2024  Check In:  Session Check In - 01/20/24 1727       Check-In   Supervising physician immediately available to respond to emergencies See telemetry face sheet for immediately available ER MD    Location [facility]-Cardiac & Pulmonary Rehab    Staff Present Rollene Paterson, MS, Exercise Physiologist;Maxon Conetta BS, Exercise Physiologist;Joseph Rolinda RCP,RRT,BSRT;Moneisha Vosler RN,BSN    Virtual Visit No    Medication changes reported     No    Fall or balance concerns reported    No    Tobacco Cessation No Change    Warm-up and Cool-down Performed on first and last piece of equipment    Resistance Training Performed Yes    VAD Patient? No    PAD/SET Patient? No      Pain Assessment   Currently in Pain? No/denies             Social History   Tobacco Use  Smoking Status Former   Types: Cigarettes   Start date: 10/27/1998   Quit date: 10/27/1983   Years since quitting: 40.2  Smokeless Tobacco Never  Tobacco Comments   Quit 25ya, 15 pack year    Goals Met:  Independence with exercise equipment Exercise tolerated well No report of concerns or symptoms today Strength training completed today  Goals Unmet:  Not Applicable  Comments: Pt able to follow exercise prescription today without complaint.  Will continue to monitor for progression.    Dr. Oneil Pinal is Medical Director for [facility] Cardiac Rehabilitation.  Dr. Fuad Aleskerov is Medical Director for [facility] Pulmonary Rehabilitation.

## 2024-01-24 ENCOUNTER — Encounter: Admitting: Emergency Medicine

## 2024-01-24 DIAGNOSIS — I214 Non-ST elevation (NSTEMI) myocardial infarction: Secondary | ICD-10-CM

## 2024-01-24 DIAGNOSIS — I252 Old myocardial infarction: Secondary | ICD-10-CM | POA: Diagnosis not present

## 2024-01-24 NOTE — Progress Notes (Signed)
 Daily Session Note  Patient Details  Name: Angel Douglas MRN: [id number] Date of Birth: 04-08-1961 Referring Provider:   Flowsheet Row Cardiac Rehab from 11/02/2023 in [facility] Cardiac and Pulmonary Rehab  Referring Provider Dr. Cara Lovelace    Encounter Date: 01/24/2024  Check In:  Session Check In - 01/24/24 1746       Check-In   Supervising physician immediately available to respond to emergencies See telemetry face sheet for immediately available ER MD    Location [facility]-Cardiac & Pulmonary Rehab    Staff Present Leita Franks RN,BSN;Joseph [facility] BS, ACSM CEP, Exercise Physiologist    Virtual Visit No    Medication changes reported     No    Fall or balance concerns reported    No    Tobacco Cessation No Change    Warm-up and Cool-down Performed on first and last piece of equipment    Resistance Training Performed Yes    VAD Patient? No    PAD/SET Patient? No      Pain Assessment   Currently in Pain? No/denies             Social History   Tobacco Use  Smoking Status Former   Types: Cigarettes   Start date: 10/27/1998   Quit date: 10/27/1983   Years since quitting: 40.2  Smokeless Tobacco Never  Tobacco Comments   Quit 25ya, 15 pack year    Goals Met:  Independence with exercise equipment Exercise tolerated well No report of concerns or symptoms today Strength training completed today  Goals Unmet:  Not Applicable  Comments: Pt able to follow exercise prescription today without complaint.  Will continue to monitor for progression.    Dr. Oneil Pinal is Medical Director for [facility] Cardiac Rehabilitation.  Dr. Fuad Aleskerov is Medical Director for [facility] Pulmonary Rehabilitation.

## 2024-01-26 ENCOUNTER — Encounter: Attending: Internal Medicine | Admitting: Emergency Medicine

## 2024-01-26 DIAGNOSIS — I214 Non-ST elevation (NSTEMI) myocardial infarction: Secondary | ICD-10-CM | POA: Diagnosis present

## 2024-01-26 NOTE — Progress Notes (Signed)
 Daily Session Note  Patient Details  Name: Angel Douglas MRN: [id number] Date of Birth: [birth year] Referring Provider:   Flowsheet Row Cardiac Rehab from 11/02/2023 in [facility] Cardiac and Pulmonary Rehab  Referring Provider Dr. Cara Lovelace    Encounter Date: 01/26/2024  Check In:  Session Check In - 01/26/24 1729       Check-In   Supervising physician immediately available to respond to emergencies See telemetry face sheet for immediately available ER MD    Location [facility]-Cardiac & Pulmonary Rehab    Staff Present Leita Franks RN,BSN;Joseph [facility] BS, ACSM CEP, Exercise Physiologist    Virtual Visit No    Medication changes reported     No    Fall or balance concerns reported    No    Tobacco Cessation No Change    Warm-up and Cool-down Performed on first and last piece of equipment    Resistance Training Performed Yes    VAD Patient? No    PAD/SET Patient? No      Pain Assessment   Currently in Pain? No/denies             Social History   Tobacco Use  Smoking Status Former   Types: Cigarettes   Start date: 10/27/1998   Quit date: 10/27/1983   Years since quitting: 40.2  Smokeless Tobacco Never  Tobacco Comments   Quit 25ya, 15 pack year    Goals Met:  Independence with exercise equipment Exercise tolerated well No report of concerns or symptoms today Strength training completed today  Goals Unmet:  Not Applicable  Comments: Pt able to follow exercise prescription today without complaint.  Will continue to monitor for progression.    Dr. Oneil Pinal is Medical Director for [facility] Cardiac Rehabilitation.  Dr. Fuad Aleskerov is Medical Director for [facility] Pulmonary Rehabilitation.

## 2024-01-27 ENCOUNTER — Encounter: Admitting: Emergency Medicine

## 2024-01-27 DIAGNOSIS — I214 Non-ST elevation (NSTEMI) myocardial infarction: Secondary | ICD-10-CM | POA: Diagnosis not present

## 2024-01-27 NOTE — Progress Notes (Signed)
 Daily Session Note  Patient Details  Name: Angel Douglas MRN: [id number] Date of Birth: 1961-01-01 Referring Provider:   Flowsheet Row Cardiac Rehab from 11/02/2023 in [facility] Cardiac and Pulmonary Rehab  Referring Provider Dr. Cara Lovelace    Encounter Date: 01/27/2024  Check In:  Session Check In - 01/27/24 1711       Check-In   Supervising physician immediately available to respond to emergencies See telemetry face sheet for immediately available ER MD    Location [facility]-Cardiac & Pulmonary Rehab    Staff Present Maxon Conetta BS, Exercise Physiologist;Joseph Rolinda RCP,RRT,BSRT;Jaziah Kwasnik RN,BSN    Virtual Visit No    Medication changes reported     No    Fall or balance concerns reported    No    Tobacco Cessation No Change    Warm-up and Cool-down Performed on first and last piece of equipment    Resistance Training Performed Yes    VAD Patient? No    PAD/SET Patient? No      Pain Assessment   Currently in Pain? No/denies             Social History   Tobacco Use  Smoking Status Former   Types: Cigarettes   Start date: 10/27/1998   Quit date: 10/27/1983   Years since quitting: 40.2  Smokeless Tobacco Never  Tobacco Comments   Quit 25ya, 15 pack year    Goals Met:  Independence with exercise equipment Exercise tolerated well No report of concerns or symptoms today Strength training completed today  Goals Unmet:  Not Applicable  Comments: Pt able to follow exercise prescription today without complaint.  Will continue to monitor for progression.    Dr. Oneil Pinal is Medical Director for [facility] Cardiac Rehabilitation.  Dr. Fuad Aleskerov is Medical Director for [facility] Pulmonary Rehabilitation.

## 2024-01-31 ENCOUNTER — Encounter: Admitting: Emergency Medicine

## 2024-01-31 DIAGNOSIS — I214 Non-ST elevation (NSTEMI) myocardial infarction: Secondary | ICD-10-CM

## 2024-01-31 NOTE — Progress Notes (Signed)
 Daily Session Note  Patient Details  Name: Angel Douglas MRN: [id number] Date of Birth: 12-01-1960 Referring Provider:   Flowsheet Row Cardiac Rehab from 11/02/2023 in [facility] Cardiac and Pulmonary Rehab  Referring Provider Dr. Cara Lovelace    Encounter Date: 01/31/2024  Check In:  Session Check In - 01/31/24 1737       Check-In   Supervising physician immediately available to respond to emergencies See telemetry face sheet for immediately available ER MD    Location [facility]-Cardiac & Pulmonary Rehab    Staff Present Burnard Hint BS, ACSM CEP, Exercise Physiologist;Joseph Rolinda RCP,RRT,BSRT;Kierre Hintz RN,BSN    Virtual Visit No    Medication changes reported     No    Fall or balance concerns reported    No    Tobacco Cessation No Change    Warm-up and Cool-down Performed on first and last piece of equipment    Resistance Training Performed Yes    VAD Patient? No    PAD/SET Patient? No      Pain Assessment   Currently in Pain? No/denies             Social History   Tobacco Use  Smoking Status Former   Types: Cigarettes   Start date: 10/27/1998   Quit date: 10/27/1983   Years since quitting: 40.2  Smokeless Tobacco Never  Tobacco Comments   Quit 25ya, 15 pack year    Goals Met:  Independence with exercise equipment Exercise tolerated well No report of concerns or symptoms today Strength training completed today  Goals Unmet:  Not Applicable  Comments: Pt able to follow exercise prescription today without complaint.  Will continue to monitor for progression.    Dr. Oneil Pinal is Medical Director for [facility] Cardiac Rehabilitation.  Dr. Fuad Aleskerov is Medical Director for [facility] Pulmonary Rehabilitation.

## 2024-02-02 ENCOUNTER — Encounter: Admitting: Emergency Medicine

## 2024-02-02 VITALS — Ht 60.0 in | Wt 144.4 lb

## 2024-02-02 DIAGNOSIS — I214 Non-ST elevation (NSTEMI) myocardial infarction: Secondary | ICD-10-CM | POA: Diagnosis not present

## 2024-02-02 NOTE — Patient Instructions (Signed)
 Discharge Patient Instructions  Patient Details  Name: Angel Douglas MRN: [id number] Date of Birth: Apr 24, 1961 Referring Provider:  Lorel Maxie LABOR, MD   Number of Visits: 9  Reason for Discharge:  Patient reached a stable level of exercise. Patient independent in their exercise. Patient has met program and personal goals.  Smoking History:  Social History   Tobacco Use  Smoking Status Former   Types: Cigarettes   Start date: 10/27/1998   Quit date: 10/27/1983   Years since quitting: 40.2  Smokeless Tobacco Never  Tobacco Comments   Quit 25ya, 15 pack year    Diagnosis:  NSTEMI (non-ST elevated myocardial infarction) (HCC)  Initial Exercise Prescription:  Initial Exercise Prescription - 11/02/23 1500       Date of Initial Exercise RX and Referring Provider   Date 11/02/23    Referring Provider Dr. Cara Lovelace      Oxygen   Maintain Oxygen Saturation 88% or higher      Treadmill   MPH 2.7    Grade 0    Minutes 15    METs 3.07      NuStep   Level 2    SPM 80    Minutes 15    METs 3.43      REL-XR   Level 2    Watts 25    Speed 50    Minutes 15    METs 3.43      T5 Nustep   Level 2   T6   SPM 80    Minutes 15    METs 3.43      Prescription Details   Duration Progress to 30 minutes of continuous aerobic without signs/symptoms of physical distress      Intensity   THRR 40-80% of Max Heartrate 101-139    Ratings of Perceived Exertion 11-13    Perceived Dyspnea 0-4      Progression   Progression Continue to progress workloads to maintain intensity without signs/symptoms of physical distress.      Resistance Training   Training Prescription Yes    Weight 7lb    Reps 10-15          Discharge Exercise Prescription (Final Exercise Prescription Changes):  Exercise Prescription Changes - 01/25/24 0900       Response to Exercise   Blood Pressure (Admit) 108/68    Blood Pressure (Exit) 120/80    Heart Rate (Admit) 78 bpm    Heart  Rate (Exercise) 1414 bpm    Heart Rate (Exit) 94 bpm    Rating of Perceived Exertion (Exercise) 15    Symptoms none    Duration Continue with 30 min of aerobic exercise without signs/symptoms of physical distress.    Intensity THRR unchanged      Progression   Progression Continue to progress workloads to maintain intensity without signs/symptoms of physical distress.    Average METs 5.77      Resistance Training   Training Prescription Yes    Weight 3 lb    Reps 10-15      Interval Training   Interval Training No      Treadmill   MPH 3.4    Grade 7    Minutes 15    METs 6.88      Rower   Level 10    Watts 60    Minutes 15    METs 6.53      Home Exercise Plan   Plans to continue exercise at [employer] (  comment)    Frequency Add 2 additional days to program exercise sessions.    Initial Home Exercises Provided 12/06/23      Oxygen   Maintain Oxygen Saturation 88% or higher          Functional Capacity:  6 Minute Walk     Row Name 11/02/23 1509 02/02/24 1734       6 Minute Walk   Phase Initial Discharge    Distance 1440 feet 1605 feet    Distance % Change -- 11 %    Distance Feet Change -- 165 ft    Walk Time 6 minutes 6 minutes    # of Rest Breaks 0 0    MPH 2.73 3.04    METS 3.43 3.8    RPE 7 10    Perceived Dyspnea  0 0    VO2 Peak 12 13.32    Symptoms No No    Resting HR 64 bpm 74 bpm    Resting BP 106/64 128/82    Resting Oxygen Saturation  96 % --    Exercise Oxygen Saturation  during 6 min walk 96 % --    Max Ex. HR 101 bpm 103 bpm    Max Ex. BP 124/66 142/80    2 Minute Post BP 114/60 124/80       Nutrition & Weight - Outcomes:  Pre Biometrics - 11/02/23 1514       Pre Biometrics   Height 5' (1.524 m)    Weight 137 lb (62.1 kg)    Waist Circumference 30 inches    Hip Circumference 39 inches    Waist to Hip Ratio 0.77 %    BMI (Calculated) 26.76    Single Leg Stand 22 seconds          Post Biometrics - 02/02/24 1736         Post  Biometrics   Height 5' (1.524 m)    Weight 144 lb 6.4 oz (65.5 kg)    Waist Circumference 33.5 inches    Hip Circumference 39 inches    Waist to Hip Ratio 0.86 %    BMI (Calculated) 28.2    Single Leg Stand 26.47 seconds          Goals reviewed with patient; copy given to patient.

## 2024-02-02 NOTE — Progress Notes (Unsigned)
 Daily Session Note  Patient Details  Name: Angel Douglas MRN: [id number] Date of Birth: [birth year] Referring Provider:   Flowsheet Row Cardiac Rehab from 11/02/2023 in [facility] Cardiac and Pulmonary Rehab  Referring Provider Dr. Cara Lovelace    Encounter Date: 02/02/2024  Check In:  Session Check In - 02/02/24 1715       Check-In   Supervising physician immediately available to respond to emergencies See telemetry face sheet for immediately available ER MD    Location [facility]-Cardiac & Pulmonary Rehab    Staff Present Leita Franks RN,BSN;Joseph [facility] BS, ACSM CEP, Exercise Physiologist    Virtual Visit No    Medication changes reported     No    Fall or balance concerns reported    No    Tobacco Cessation No Change    Warm-up and Cool-down Performed on first and last piece of equipment    Resistance Training Performed Yes    VAD Patient? No    PAD/SET Patient? No      Pain Assessment   Currently in Pain? No/denies             Social History   Tobacco Use  Smoking Status Former   Types: Cigarettes   Start date: 10/27/1998   Quit date: 10/27/1983   Years since quitting: 40.2  Smokeless Tobacco Never  Tobacco Comments   Quit 25ya, 15 pack year    Goals Met:  Independence with exercise equipment Exercise tolerated well No report of concerns or symptoms today Strength training completed today  Goals Unmet:  Not Applicable  Comments: Pt able to follow exercise prescription today without complaint.  Will continue to monitor for progression.   6 Minute Walk     Row Name 11/02/23 1509 02/02/24 1734       6 Minute Walk   Phase Initial Discharge    Distance 1440 feet 1605 feet    Distance % Change -- 11 %    Distance Feet Change -- 165 ft    Walk Time 6 minutes 6 minutes    # of Rest Breaks 0 0    MPH 2.73 3.04    METS 3.43 3.8    RPE 7 10    Perceived Dyspnea  0 0    VO2 Peak 12 13.32    Symptoms No No    Resting HR 64 bpm 74 bpm     Resting BP 106/64 128/82    Resting Oxygen Saturation  96 % --    Exercise Oxygen Saturation  during 6 min walk 96 % --    Max Ex. HR 101 bpm 103 bpm    Max Ex. BP 124/66 142/80    2 Minute Post BP 114/60 124/80        Dr. Oneil Pinal is Medical Director for [facility] Cardiac Rehabilitation.  Dr. Fuad Aleskerov is Medical Director for [facility] Pulmonary Rehabilitation.

## 2024-02-03 ENCOUNTER — Encounter

## 2024-02-07 ENCOUNTER — Encounter: Admitting: Emergency Medicine

## 2024-02-07 DIAGNOSIS — I214 Non-ST elevation (NSTEMI) myocardial infarction: Secondary | ICD-10-CM

## 2024-02-07 NOTE — Progress Notes (Signed)
 Daily Session Note  Patient Details  Name: Angel Douglas MRN: [id number] Date of Birth: 05-Dec-1960 Referring Provider:   Flowsheet Row Cardiac Rehab from 11/02/2023 in [facility] Cardiac and Pulmonary Rehab  Referring Provider Dr. Cara Lovelace    Encounter Date: 02/07/2024  Check In:  Session Check In - 02/07/24 1738       Check-In   Supervising physician immediately available to respond to emergencies See telemetry face sheet for immediately available ER MD    Location [facility]-Cardiac & Pulmonary Rehab    Staff Present Leita Franks RN,BSN;Joseph [facility] BS, ACSM CEP, Exercise Physiologist    Virtual Visit No    Medication changes reported     No    Fall or balance concerns reported    No    Tobacco Cessation No Change    Warm-up and Cool-down Performed on first and last piece of equipment    Resistance Training Performed Yes    VAD Patient? No    PAD/SET Patient? No      Pain Assessment   Currently in Pain? No/denies             Social History   Tobacco Use  Smoking Status Former   Types: Cigarettes   Start date: 10/27/1998   Quit date: 10/27/1983   Years since quitting: 40.3  Smokeless Tobacco Never  Tobacco Comments   Quit 25ya, 15 pack year    Goals Met:  Independence with exercise equipment Exercise tolerated well No report of concerns or symptoms today Strength training completed today  Goals Unmet:  Not Applicable  Comments: Pt able to follow exercise prescription today without complaint.  Will continue to monitor for progression.    Dr. Oneil Pinal is Medical Director for [facility] Cardiac Rehabilitation.  Dr. Fuad Aleskerov is Medical Director for [facility] Pulmonary Rehabilitation.

## 2024-02-09 ENCOUNTER — Encounter

## 2024-02-10 ENCOUNTER — Encounter

## 2024-02-14 ENCOUNTER — Encounter: Admitting: Emergency Medicine

## 2024-02-14 DIAGNOSIS — I214 Non-ST elevation (NSTEMI) myocardial infarction: Secondary | ICD-10-CM | POA: Diagnosis not present

## 2024-02-14 NOTE — Progress Notes (Signed)
 Daily Session Note  Patient Details  Name: Angel Douglas MRN: [id number] Date of Birth: 1960-05-19 Referring Provider:   Flowsheet Row Cardiac Rehab from 11/02/2023 in [facility] Cardiac and Pulmonary Rehab  Referring Provider Dr. Cara Lovelace    Encounter Date: 02/14/2024  Check In:  Session Check In - 02/14/24 1735       Check-In   Supervising physician immediately available to respond to emergencies See telemetry face sheet for immediately available ER MD    Location [facility]-Cardiac & Pulmonary Rehab    Staff Present Burnard Hint BS, ACSM CEP, Exercise Physiologist;Joseph Rolinda RCP,RRT,BSRT;Erilyn Pearman RN,BSN    Virtual Visit No    Medication changes reported     No    Fall or balance concerns reported    No    Tobacco Cessation No Change    Warm-up and Cool-down Performed on first and last piece of equipment    Resistance Training Performed Yes    VAD Patient? No    PAD/SET Patient? No      Pain Assessment   Currently in Pain? No/denies             Social History   Tobacco Use  Smoking Status Former   Types: Cigarettes   Start date: 10/27/1998   Quit date: 10/27/1983   Years since quitting: 40.3  Smokeless Tobacco Never  Tobacco Comments   Quit 25ya, 15 pack year    Goals Met:  Independence with exercise equipment Exercise tolerated well No report of concerns or symptoms today Strength training completed today  Goals Unmet:  Not Applicable  Comments: Pt able to follow exercise prescription today without complaint.  Will continue to monitor for progression.    Dr. Oneil Pinal is Medical Director for [facility] Cardiac Rehabilitation.  Dr. Fuad Aleskerov is Medical Director for [facility] Pulmonary Rehabilitation.

## 2024-02-16 ENCOUNTER — Encounter

## 2024-02-16 DIAGNOSIS — I214 Non-ST elevation (NSTEMI) myocardial infarction: Secondary | ICD-10-CM

## 2024-02-16 NOTE — Progress Notes (Signed)
 Cardiac Individual Treatment Plan  Patient Details  Name: Angel Douglas MRN: [id number] Date of Birth: Dec 12, 1960 Referring Provider:   Flowsheet Row Cardiac Rehab from 11/02/2023 in [facility] Cardiac and Pulmonary Rehab  Referring Provider Dr. Cara Lovelace    Initial Encounter Date:  Flowsheet Row Cardiac Rehab from 11/02/2023 in [facility] Cardiac and Pulmonary Rehab  Date 11/02/23    Visit Diagnosis: NSTEMI (non-ST elevated myocardial infarction) [facility])  Patient's Home Medications on Admission:  Current Outpatient Medications:    acetaminophen  (TYLENOL ) 325 MG tablet, Take 2 tablets (650 mg total) by mouth every 6 (six) hours as needed for mild pain (or Fever >/= 101)., Disp: 20 tablet, Rfl: 0   ALLEGRA-D ALLERGY & CONGESTION 180-240 MG 24 hr tablet, Take 1 tablet by mouth daily., Disp: , Rfl:    aspirin  EC 81 MG tablet, Take 1 tablet (81 mg total) by mouth daily. Swallow whole., Disp: 30 tablet, Rfl: 11   buPROPion (WELLBUTRIN XL) 300 MG 24 hr tablet, Take 300 mg by mouth daily., Disp: , Rfl:    estradiol (ESTRACE) 0.1 MG/GM vaginal cream, Place vaginally., Disp: , Rfl:    isosorbide  mononitrate (IMDUR ) 30 MG 24 hr tablet, Take 1 tablet (30 mg total) by mouth daily. (Patient not taking: Reported on 10/27/2023), Disp: 30 tablet, Rfl: 11   metoprolol  succinate (TOPROL -XL) 25 MG 24 hr tablet, Take 0.5 tablets (12.5 mg total) by mouth daily. (Patient not taking: Reported on 10/27/2023), Disp: 15 tablet, Rfl: 11   MOUNJARO 15 MG/0.5ML Pen, , Disp: , Rfl:    Multiple Vitamin (MULTI-VITAMINS) TABS, Take by mouth., Disp: , Rfl:    rosuvastatin  (CRESTOR ) 5 MG tablet, Take 1 tablet (5 mg total) by mouth daily., Disp: 30 tablet, Rfl: 11  Past Medical History: Past Medical History:  Diagnosis Date   Allergy    Depression    Hypertension    Preeclampsia     Tobacco Use: Social History   Tobacco Use  Smoking Status Former   Types: Cigarettes   Start date: 10/27/1998   Quit date: 10/27/1983    Years since quitting: 40.3  Smokeless Tobacco Never  Tobacco Comments   Quit 25ya, 15 pack year    Labs: Review Flowsheet       Latest Ref Rng & Units 10/16/2023  Labs for ITP Cardiac and Pulmonary Rehab  Cholestrol 0 - 200 mg/dL 845   LDL (calc) 0 - 99 mg/dL 92   HDL-C >59 mg/dL 54   Trlycerides <849 mg/dL 42      Exercise Target Goals: Exercise Program Goal: Individual exercise prescription set using results from initial 6 min walk test and THRR while considering  patient's activity barriers and safety.   Exercise Prescription Goal: Initial exercise prescription builds to 30-45 minutes a day of aerobic activity, 2-3 days per week.  Home exercise guidelines will be given to patient during program as part of exercise prescription that the participant will acknowledge.   Education: Aerobic Exercise: - Group verbal and visual presentation on the components of exercise prescription. Introduces F.I.T.T principle from ACSM for exercise prescriptions.  Reviews F.I.T.T. principles of aerobic exercise including progression. Written material provided at class time.   Education: Resistance Exercise: - Group verbal and visual presentation on the components of exercise prescription. Introduces F.I.T.T principle from ACSM for exercise prescriptions  Reviews F.I.T.T. principles of resistance exercise including progression. Written material provided at class time.    Education: Exercise & Equipment Safety: - Individual verbal instruction and demonstration of  equipment use and safety with use of the equipment. Flowsheet Row Cardiac Rehab from 11/02/2023 in [facility] Cardiac and Pulmonary Rehab  Date 11/02/23  Educator [facility]  Instruction Review Code 1- Verbalizes Understanding    Education: Exercise Physiology & General Exercise Guidelines: - Group verbal and written instruction with models to review the exercise physiology of the cardiovascular system and associated critical values. Provides general  exercise guidelines with specific guidelines to those with heart or lung disease. Written material provided at class time.   Education: Flexibility, Balance, Mind/Body Relaxation: - Group verbal and visual presentation with interactive activity on the components of exercise prescription. Introduces F.I.T.T principle from ACSM for exercise prescriptions. Reviews F.I.T.T. principles of flexibility and balance exercise training including progression. Also discusses the mind body connection.  Reviews various relaxation techniques to help reduce and manage stress (i.e. Deep breathing, progressive muscle relaxation, and visualization). Balance handout provided to take home. Written material provided at class time.   Activity Barriers & Risk Stratification:  Activity Barriers & Cardiac Risk Stratification - 11/02/23 1510       Activity Barriers & Cardiac Risk Stratification   Activity Barriers None    Cardiac Risk Stratification Moderate          6 Minute Walk:  6 Minute Walk     Row Name 11/02/23 1509 02/02/24 1734       6 Minute Walk   Phase Initial Discharge    Distance 1440 feet 1605 feet    Distance % Change -- 11 %    Distance Feet Change -- 165 ft    Walk Time 6 minutes 6 minutes    # of Rest Breaks 0 0    MPH 2.73 3.04    METS 3.43 3.8    RPE 7 10    Perceived Dyspnea  0 0    VO2 Peak 12 13.32    Symptoms No No    Resting HR 64 bpm 74 bpm    Resting BP 106/64 128/82    Resting Oxygen Saturation  96 % --    Exercise Oxygen Saturation  during 6 min walk 96 % --    Max Ex. HR 101 bpm 103 bpm    Max Ex. BP 124/66 142/80    2 Minute Post BP 114/60 124/80       Oxygen Initial Assessment:   Oxygen Re-Evaluation:   Oxygen Discharge (Final Oxygen Re-Evaluation):   Initial Exercise Prescription:  Initial Exercise Prescription - 11/02/23 1500       Date of Initial Exercise RX and Referring Provider   Date 11/02/23    Referring Provider Dr. Cara Lovelace       Oxygen   Maintain Oxygen Saturation 88% or higher      Treadmill   MPH 2.7    Grade 0    Minutes 15    METs 3.07      NuStep   Level 2    SPM 80    Minutes 15    METs 3.43      REL-XR   Level 2    Watts 25    Speed 50    Minutes 15    METs 3.43      T5 Nustep   Level 2   T6   SPM 80    Minutes 15    METs 3.43      Prescription Details   Duration Progress to 30 minutes of continuous aerobic without signs/symptoms of physical distress  Intensity   THRR 40-80% of Max Heartrate 101-139    Ratings of Perceived Exertion 11-13    Perceived Dyspnea 0-4      Progression   Progression Continue to progress workloads to maintain intensity without signs/symptoms of physical distress.      Resistance Training   Training Prescription Yes    Weight 7lb    Reps 10-15          Perform Capillary Blood Glucose checks as needed.  Exercise Prescription Changes:   Exercise Prescription Changes     Row Name 11/02/23 1500 11/19/23 1000 12/01/23 1500 12/06/23 1700 12/13/23 1600     Response to Exercise   Blood Pressure (Admit) 106/64 108/72 118/58 -- 120/60   Blood Pressure (Exercise) 124/66 140/58 156/82 -- --   Blood Pressure (Exit) 114/66 96/70 118/64 -- 100/62   Heart Rate (Admit) 64 bpm 72 bpm 70 bpm -- 70 bpm   Heart Rate (Exercise) 101 bpm 122 bpm 131 bpm -- 124 bpm   Heart Rate (Exit) 66 bpm 90 bpm 74 bpm -- 88 bpm   Oxygen Saturation (Admit) 96 % -- -- -- --   Oxygen Saturation (Exercise) 96 % -- -- -- --   Oxygen Saturation (Exit) 96 % -- -- -- --   Rating of Perceived Exertion (Exercise) 7 15 15  -- 15   Perceived Dyspnea (Exercise) 0 0 0 -- 0   Symptoms none none none -- none   Comments results first 2 weeks of exercise -- -- --   Duration -- Progress to 30 minutes of  aerobic without signs/symptoms of physical distress Progress to 30 minutes of  aerobic without signs/symptoms of physical distress Progress to 30 minutes of  aerobic without  signs/symptoms of physical distress Continue with 30 min of aerobic exercise without signs/symptoms of physical distress.   Intensity -- THRR unchanged THRR unchanged THRR unchanged THRR unchanged     Progression   Progression -- Continue to progress workloads to maintain intensity without signs/symptoms of physical distress. Continue to progress workloads to maintain intensity without signs/symptoms of physical distress. Continue to progress workloads to maintain intensity without signs/symptoms of physical distress. Continue to progress workloads to maintain intensity without signs/symptoms of physical distress.   Average METs -- 5.2 5.38 5.38 4.94     Resistance Training   Training Prescription -- Yes Yes Yes Yes   Weight -- 7lb 7lb 7lb 3 lb   Reps -- 10-15 10-15 10-15 10-15     Interval Training   Interval Training -- No No No No     Treadmill   MPH -- 3.3 3 3  3.4   Grade -- 5 8 8 7    Minutes -- 15 15 15 15    METs -- 5.8 6.61 6.61 6.88     NuStep   Level -- 2 2 2 4   T6: 4   Minutes -- 15 15 15 15    METs -- 3.1 3.7 3.7 4  T6: 2.5     REL-XR   Level -- 5 10 10 7    Minutes -- 15 15 15 15    METs -- 6.8 7.3 7.3 6     T5 Nustep   Level -- -- 2 2 --   Minutes -- -- 15 15 --   METs -- -- 3 3 --     Rower   Level -- -- 3 3 2    Watts -- -- 41 41 39   Minutes -- -- 15 15 15  METs -- -- 4.37 4.37 5.57     Home Exercise Plan   Plans to continue exercise at -- -- -- [employer] (comment) [employer] (comment)   Frequency -- -- -- Add 2 additional days to program exercise sessions. Add 2 additional days to program exercise sessions.   Initial Home Exercises Provided -- -- -- 12/06/23 12/06/23     Oxygen   Maintain Oxygen Saturation -- 88% or higher 88% or higher 88% or higher 88% or higher    Row Name 12/29/23 1000 01/11/24 1400 01/25/24 0900 02/09/24 1600       Response to Exercise   Blood Pressure (Admit) 106/62 104/60 108/68 128/82    Blood Pressure  (Exit) 102/62 106/60 120/80 110/62    Heart Rate (Admit) 60 bpm 80 bpm 78 bpm 81 bpm    Heart Rate (Exercise) 135 bpm 137 bpm 1414 bpm 144 bpm    Heart Rate (Exit) 85 bpm 90 bpm 94 bpm 99 bpm    Rating of Perceived Exertion (Exercise) 13 13 15 15     Symptoms none none none none    Duration Continue with 30 min of aerobic exercise without signs/symptoms of physical distress. Continue with 30 min of aerobic exercise without signs/symptoms of physical distress. Continue with 30 min of aerobic exercise without signs/symptoms of physical distress. Continue with 30 min of aerobic exercise without signs/symptoms of physical distress.    Intensity THRR unchanged THRR unchanged THRR unchanged THRR unchanged      Progression   Progression Continue to progress workloads to maintain intensity without signs/symptoms of physical distress. Continue to progress workloads to maintain intensity without signs/symptoms of physical distress. Continue to progress workloads to maintain intensity without signs/symptoms of physical distress. Continue to progress workloads to maintain intensity without signs/symptoms of physical distress.    Average METs 6.02 5.87 5.77 6.27      Resistance Training   Training Prescription Yes Yes Yes Yes    Weight 3 lb 3 lb 3 lb 3 lb    Reps 10-15 10-15 10-15 10-15      Interval Training   Interval Training No No No No      Treadmill   MPH 3.1 3.3 3.4 3.1    Grade 6 5 7  7.5    Minutes 15 15 15 15     METs 5.95 5.8 6.88 6.58      NuStep   Level 5 -- -- --    Minutes 25 -- -- --    METs 7.4 -- -- --      REL-XR   Level 6 -- -- --    Minutes 15 -- -- --    METs 5.7 -- -- --      Rower   Level 10 6 10 10     Watts 61 62 60 62    Minutes 15 15 15 15     METs 6.67 6.59 6.53 6.66      Home Exercise Plan   Plans to continue exercise at [employer] (comment) [employer] (comment) [employer] (comment) [employer] (comment)    Frequency Add 2  additional days to program exercise sessions. Add 2 additional days to program exercise sessions. Add 2 additional days to program exercise sessions. Add 2 additional days to program exercise sessions.    Initial Home Exercises Provided 12/06/23 12/06/23 12/06/23 12/06/23      Oxygen   Maintain Oxygen Saturation 88% or higher 88% or higher 88% or higher 88% or higher  Exercise Comments:   Exercise Comments     Row Name 11/08/23 1719           Exercise Comments First full day of exercise!  Patient was oriented to gym and equipment including functions, settings, policies, and procedures.  Patient's individual exercise prescription and treatment plan were reviewed.  All starting workloads were established based on the results of the 6 minute walk test done at initial orientation visit.  The plan for exercise progression was also introduced and progression will be customized based on patient's performance and goals.          Exercise Goals and Review:   Exercise Goals     Row Name 11/02/23 1513             Exercise Goals   Increase Physical Activity Yes       Intervention Provide advice, education, support and counseling about physical activity/exercise needs.;Develop an individualized exercise prescription for aerobic and resistive training based on initial evaluation findings, risk stratification, comorbidities and participant's personal goals.       Expected Outcomes Short Term: Attend rehab on a regular basis to increase amount of physical activity.;Long Term: Add in home exercise to make exercise part of routine and to increase amount of physical activity.;Long Term: Exercising regularly at least 3-5 days a week.       Increase Strength and Stamina Yes       Intervention Provide advice, education, support and counseling about physical activity/exercise needs.;Develop an individualized exercise prescription for aerobic and resistive training based on initial evaluation  findings, risk stratification, comorbidities and participant's personal goals.       Expected Outcomes Short Term: Increase workloads from initial exercise prescription for resistance, speed, and METs.;Short Term: Perform resistance training exercises routinely during rehab and add in resistance training at home;Long Term: Improve cardiorespiratory fitness, muscular endurance and strength as measured by increased METs and functional capacity ( )       Able to understand and use rate of perceived exertion (RPE) scale Yes       Intervention Provide education and explanation on how to use RPE scale       Expected Outcomes Short Term: Able to use RPE daily in rehab to express subjective intensity level;Long Term:  Able to use RPE to guide intensity level when exercising independently       Able to understand and use Dyspnea scale Yes       Intervention Provide education and explanation on how to use Dyspnea scale       Expected Outcomes Long Term: Able to use Dyspnea scale to guide intensity level when exercising independently;Short Term: Able to use Dyspnea scale daily in rehab to express subjective sense of shortness of breath during exertion       Knowledge and understanding of Target Heart Rate Range (THRR) Yes       Intervention Provide education and explanation of THRR including how the numbers were predicted and where they are located for reference       Expected Outcomes Short Term: Able to state/look up THRR;Short Term: Able to use daily as guideline for intensity in rehab;Long Term: Able to use THRR to govern intensity when exercising independently       Able to check pulse independently Yes       Intervention Provide education and demonstration on how to check pulse in carotid and radial arteries.;Review the importance of being able to check your own pulse for safety during independent exercise  Expected Outcomes Short Term: Able to explain why pulse checking is important during  independent exercise;Long Term: Able to check pulse independently and accurately       Understanding of Exercise Prescription Yes       Intervention Provide education, explanation, and written materials on patient's individual exercise prescription       Expected Outcomes Short Term: Able to explain program exercise prescription;Long Term: Able to explain home exercise prescription to exercise independently          Exercise Goals Re-Evaluation :  Exercise Goals Re-Evaluation     Row Name 11/08/23 1719 11/19/23 1029 12/01/23 1557 12/06/23 1736 12/13/23 1608     Exercise Goal Re-Evaluation   Exercise Goals Review Increase Physical Activity;Knowledge and understanding of Target Heart Rate Range (THRR);Able to understand and use rate of perceived exertion (RPE) scale;Understanding of Exercise Prescription;Increase Strength and Stamina;Able to check pulse independently Increase Physical Activity;Increase Strength and Stamina;Understanding of Exercise Prescription Increase Physical Activity;Increase Strength and Stamina;Understanding of Exercise Prescription Increase Physical Activity;Increase Strength and Stamina;Able to understand and use rate of perceived exertion (RPE) scale;Able to understand and use Dyspnea scale;Knowledge and understanding of Target Heart Rate Range (THRR);Able to check pulse independently;Understanding of Exercise Prescription Increase Physical Activity;Increase Strength and Stamina;Understanding of Exercise Prescription   Comments Reviewed RPE and dyspnea scale, THR and program prescription with pt today.  Pt voiced understanding and was given a copy of goals to take home. Emilya is off to a great start in rehab. She was able to attend her first 3 sessions during this weeks review period. During these sessions she was able to increase from 2.7 to 3. on the treadmill, and increase to level 5 on the XR. We will continue to monitor her progress in the program. [place]ia continues to do  well in rehab. She was recently able to increase her treadmill incline from 5% to 8%, while maintaining a speed of . She was also able to increase from level 5 to level 10 on the XR. We will continue to monitor her progress in the program. Reviewed home exercise with pt today.  Pt plans to go to her community gym for exercise.  Reviewed THR, pulse, RPE, sign and symptoms, pulse oximetery and when to call 911 or MD.  Also discussed weather considerations and indoor options.  Pt voiced understanding. Delitha continues to do well in rehab. She recently increased her treadmill workload to a speed of 3.4 mph with an incline of 7%. She also improved to level 4 on the T4 nustep. She did decrease from 7 lb to 3 lb handweights for resistance training as well. We will continue to monitor her progress in the program.   Expected Outcomes Short: Use RPE daily to regulate intensity.  Long: Follow program prescription in THR. Short: Continue to follow exercise prescription. Long: Continue exercise to improve strength and stamina. Short: Continue to increase treadmill workload. Long: Continue exercise to improev strength and stamina. Short: add 1-2 days per week of exercise on off days of cardiac rehab. Long: maintain independent exercise routine upon graduation from cardiac rehab. Short: Increase back up to 7 lb hand weights for resistance training. Long: Continue exercise to increase strength and stamina.    Row Name 12/29/23 1029 01/11/24 1458 01/25/24 0931 01/31/24 1732 02/02/24 1735     Exercise Goal Re-Evaluation   Exercise Goals Review Increase Physical Activity;Increase Strength and Stamina;Understanding of Exercise Prescription Increase Physical Activity;Increase Strength and Stamina;Understanding of Exercise Prescription Increase Physical Activity;Increase Strength  and Stamina;Understanding of Exercise Prescription Increase Physical Activity;Increase Strength and Stamina;Understanding of Exercise Prescription  Increase Physical Activity;Increase Strength and Stamina;Understanding of Exercise Prescription   Comments Fenna continues to make improvements in rehab. She has been able to increase from level 4 to 5 on the T4 nustep, as well as increase from level 2 to 10 on the rower. We will continue to monitor her progress in the program. Nichele continues to do well in rehab. She recently increased her speed on the treadmill to 3.3 mph while maintaining an incline of 5%. Her workload on the rower also decreased from level 10 to level 6 since the last review. We will continue to monitor her progress in the program. Haeleigh continues to do well in rehab. She recently increased her speed on the treadmill back up to to 3.4 mph with an incline of 7%. She also improved her level on the rower back up to level 10. We will continue to monitor her progress in the program. Home exercise follow up done today. Makaiya reports that she has added one day a week of exercise at home in addtion to her 3 days a week in cardiac rehab. She is using a Engineer, drilling and walking. She reports that she is tolerating her independent exercise well with no conerns to report. Akirah improved on her 6 MWT by 11%! She will graduate from cardiac rehab in the near future and plans to use a community gym to continue with her exercise routine.   Expected Outcomes Short: Continue to increase workload on the rowing machine. Long: Continue exercise to improve strength and stamina. Short: Increase workload on the Rower back up to level 10. Long: Continue exercise to improve strength and stamina. Short: Continue to progressively increase treadmill workload. Long: Continue exercise to improve strength and stamina. Short: continue to consistently exercise one day a week outside of class, and progress to 2. Long: maintain independent exercise routine upon graduation from cardiac rehab. Short: graduate Long: maintain independent exercise routine.    Row Name 02/09/24 1621              Exercise Goal Re-Evaluation   Exercise Goals Review Increase Physical Activity;Increase Strength and Stamina;Understanding of Exercise Prescription       Comments Madalynne continues to do well in rehab and she will graduate soon. She improved by 11% on her post with a total of 1605 feet. She also increased her incline on the treadmill to 7.5% while maintaining a speed of 3.1 mph. She increased her average watts on the rower to 62 watts at level 10. We will continue to monitor her progress in the program until graduation.       Expected Outcomes Short: Graduate. Long: Continue to exercise independently.          Discharge Exercise Prescription (Final Exercise Prescription Changes):  Exercise Prescription Changes - 02/09/24 1600       Response to Exercise   Blood Pressure (Admit) 128/82    Blood Pressure (Exit) 110/62    Heart Rate (Admit) 81 bpm    Heart Rate (Exercise) 144 bpm    Heart Rate (Exit) 99 bpm    Rating of Perceived Exertion (Exercise) 15    Symptoms none    Duration Continue with 30 min of aerobic exercise without signs/symptoms of physical distress.    Intensity THRR unchanged      Progression   Progression Continue to progress workloads to maintain intensity without signs/symptoms of physical distress.  Average METs 6.27      Resistance Training   Training Prescription Yes    Weight 3 lb    Reps 10-15      Interval Training   Interval Training No      Treadmill   MPH 3.1    Grade 7.5    Minutes 15    METs 6.58      Rower   Level 10    Watts 62    Minutes 15    METs 6.66      Home Exercise Plan   Plans to continue exercise at [employer] (comment)    Frequency Add 2 additional days to program exercise sessions.    Initial Home Exercises Provided 12/06/23      Oxygen   Maintain Oxygen Saturation 88% or higher          Nutrition:  Target Goals: Understanding of nutrition guidelines, daily intake of sodium 1500mg , cholesterol  200mg , calories 30% from fat and 7% or less from saturated fats, daily to have 5 or more servings of fruits and vegetables.  Education: Nutrition 1 -Group instruction provided by verbal, written material, interactive activities, discussions, models, and posters to present general guidelines for heart healthy nutrition including macronutrients, label reading, and promoting whole foods over processed counterparts. Education serves as Pensions consultant of discussion of heart healthy eating for all. Written material provided at class time.    Education: Nutrition 2 -Group instruction provided by verbal, written material, interactive activities, discussions, models, and posters to present general guidelines for heart healthy nutrition including sodium, cholesterol, and saturated fat. Providing guidance of habit forming to improve blood pressure, cholesterol, and body weight. Written material provided at class time.     Biometrics:  Pre Biometrics - 11/02/23 1514       Pre Biometrics   Height 5' (1.524 m)    Weight 137 lb (62.1 kg)    Waist Circumference 30 inches    Hip Circumference 39 inches    Waist to Hip Ratio 0.77 %    BMI (Calculated) 26.76    Single Leg Stand 22 seconds          Post Biometrics - 02/02/24 1736        Post  Biometrics   Height 5' (1.524 m)    Weight 144 lb 6.4 oz (65.5 kg)    Waist Circumference 33.5 inches    Hip Circumference 39 inches    Waist to Hip Ratio 0.86 %    BMI (Calculated) 28.2    Single Leg Stand 26.47 seconds          Nutrition Therapy Plan and Nutrition Goals:  Nutrition Therapy & Goals - 12/13/23 1724       Nutrition Therapy   RD appointment deferred Yes          Nutrition Assessments:  MEDIFICTS Score Key: >=70 Need to make dietary changes  40-70 Heart Healthy Diet <= 40 Therapeutic Level Cholesterol Diet   Picture Your Plate Scores: <59 Unhealthy dietary pattern with much room for improvement. 41-50 Dietary pattern  unlikely to meet recommendations for good health and room for improvement. 51-60 More healthful dietary pattern, with some room for improvement.  >60 Healthy dietary pattern, although there may be some specific behaviors that could be improved.    Nutrition Goals Re-Evaluation:  Nutrition Goals Re-Evaluation     Row Name 12/13/23 1724 01/06/24 1731 02/14/24 1750         Goals   Comment Continues  to defer RD apt. Patient deferred RD appointment. Patient deferred RD appointment.        Nutrition Goals Discharge (Final Nutrition Goals Re-Evaluation):  Nutrition Goals Re-Evaluation - 02/14/24 1750       Goals   Comment Patient deferred RD appointment.          Psychosocial: Target Goals: Acknowledge presence or absence of significant depression and/or stress, maximize coping skills, provide positive support system. Participant is able to verbalize types and ability to use techniques and skills needed for reducing stress and depression.   Education: Stress, Anxiety, and Depression - Group verbal and visual presentation to define topics covered.  Reviews how body is impacted by stress, anxiety, and depression.  Also discusses healthy ways to reduce stress and to treat/manage anxiety and depression. Written material provided at class time.   Education: Sleep Hygiene -Provides group verbal and written instruction about how sleep can affect your health.  Define sleep hygiene, discuss sleep cycles and impact of sleep habits. Review good sleep hygiene tips.   Initial Review & Psychosocial Screening:  Initial Psych Review & Screening - 10/27/23 1430       Initial Review   Current issues with Current Depression;History of Depression      Family Dynamics   Good Support System? Yes    Comments Her depression started with family seperation and the climate that we live in right now. She can look to her daughter, friend and therapis for support.      Barriers   Psychosocial barriers  to participate in program The patient should benefit from training in stress management and relaxation.;There are no identifiable barriers or psychosocial needs.      Screening Interventions   Interventions Provide feedback about the scores to participant;To provide support and resources with identified psychosocial needs;Encouraged to exercise    Expected Outcomes Short Term goal: Utilizing psychosocial counselor, staff and physician to assist with identification of specific Stressors or current issues interfering with healing process. Setting desired goal for each stressor or current issue identified.;Long Term Goal: Stressors or current issues are controlled or eliminated.;Short Term goal: Identification and review with participant of any Quality of Life or Depression concerns found by scoring the questionnaire.;Long Term goal: The participant improves quality of Life and PHQ9 Scores as seen by post scores and/or verbalization of changes          Quality of Life Scores:   Scores of 19 and below usually indicate a poorer quality of life in these areas.  A difference of  2-3 points is a clinically meaningful difference.  A difference of 2-3 points in the total score of the Quality of Life Index has been associated with significant improvement in overall quality of life, self-image, physical symptoms, and general health in studies assessing change in quality of life.  PHQ-9: Review Flowsheet       11/02/2023  Depression screen PHQ 2/9  Decreased Interest 0  Down, Depressed, Hopeless 0  PHQ - 2 Score 0  Altered sleeping 0  Tired, decreased energy 1  Change in appetite 0  Feeling bad or failure about yourself  0  Trouble concentrating 0  Moving slowly or fidgety/restless 0  Suicidal thoughts 0  PHQ-9 Score 1  Difficult doing work/chores Not difficult at all   Interpretation of Total Score  Total Score Depression Severity:  1-4 = Minimal depression, 5-9 = Mild depression, 10-14 =  Moderate depression, 15-19 = Moderately severe depression, 20-27 = Severe depression  Psychosocial Evaluation and Intervention:  Psychosocial Evaluation - 10/27/23 1431       Psychosocial Evaluation & Interventions   Interventions Encouraged to exercise with the program and follow exercise prescription;Relaxation education;Stress management education    Comments Her depression started with family seperation and the climate that we live in right now. She can look to her daughter, friend and therapis for support.    Expected Outcomes Short: Start [facility] to help with mood. Long: Maintain a healthy mental state    Continue Psychosocial Services  Follow up required by staff          Psychosocial Re-Evaluation:  Psychosocial Re-Evaluation     Row Name 12/13/23 1727 01/06/24 1727 02/14/24 1751         Psychosocial Re-Evaluation   Current issues with History of Depression;Current Depression Current Psychotropic Meds;Current Depression;History of Depression History of Depression;Current Psychotropic Meds;Current Depression     Comments Allisa states that her sleep, stress, and mental health have been steady with no major changes. She states that she feels like she has the resources and support system to deal with these things in her life. She was advised that if she needed any further resources that staff could provide her with information. Kearstyn states that her depression is mostly there and now is a hard time with whats going on in the world. She does well with exercise and it helps her mood. She went to [place] to her family the last couple. Patient reports no issues with their current mental states, sleep, stress, depression or anxiety. Will follow up with patient in a few weeks for any changes.     Expected Outcomes Short: continue to attend cardiac rehab for mental health benefits of exercise. Long: maintain good mental health routine. Short: Continue to exercise regularly to support mental  health and notify staff of any changes. Long: maintain mental health and well being through teaching of rehab or prescribed medications independently. Short: Continue to exercise regularly to support mental health and notify staff of any changes. Long: maintain mental health and well being through teaching of rehab or prescribed medications independently.     Interventions Encouraged to attend Cardiac Rehabilitation for the exercise Encouraged to attend Cardiac Rehabilitation for the exercise Encouraged to attend Cardiac Rehabilitation for the exercise     Continue Psychosocial Services  Follow up required by staff Follow up required by staff No Follow up required        Psychosocial Discharge (Final Psychosocial Re-Evaluation):  Psychosocial Re-Evaluation - 02/14/24 1751       Psychosocial Re-Evaluation   Current issues with History of Depression;Current Psychotropic Meds;Current Depression    Comments Patient reports no issues with their current mental states, sleep, stress, depression or anxiety. Will follow up with patient in a few weeks for any changes.    Expected Outcomes Short: Continue to exercise regularly to support mental health and notify staff of any changes. Long: maintain mental health and well being through teaching of rehab or prescribed medications independently.    Interventions Encouraged to attend Cardiac Rehabilitation for the exercise    Continue Psychosocial Services  No Follow up required          Vocational Rehabilitation: Provide vocational rehab assistance to qualifying candidates.   Vocational Rehab Evaluation & Intervention:   Education: Education Goals: Education classes will be provided on a variety of topics geared toward better understanding of heart health and risk factor modification. Participant will state understanding/return demonstration of topics presented as  noted by education test scores.  Learning Barriers/Preferences:  Learning  Barriers/Preferences - 10/27/23 1427       Learning Barriers/Preferences   Learning Barriers None    Learning Preferences None          General Cardiac Education Topics:  AED/CPR: - Group verbal and written instruction with the use of models to demonstrate the basic use of the AED with the basic ABC's of resuscitation.   Test and Procedures: - Group verbal and visual presentation and models provide information about basic cardiac anatomy and function. Reviews the testing methods done to diagnose heart disease and the outcomes of the test results. Describes the treatment choices: Medical Management, Angioplasty, or Coronary Bypass Surgery for treating various heart conditions including Myocardial Infarction, Angina, Valve Disease, and Cardiac Arrhythmias. Written material provided at class time.   Medication Safety: - Group verbal and visual instruction to review commonly prescribed medications for heart and lung disease. Reviews the medication, class of the drug, and side effects. Includes the steps to properly store meds and maintain the prescription regimen. Written material provided at class time.   Intimacy: - Group verbal instruction through game format to discuss how heart and lung disease can affect sexual intimacy. Written material provided at class time.   Know Your Numbers and Heart Failure: - Group verbal and visual instruction to discuss disease risk factors for cardiac and pulmonary disease and treatment options.  Reviews associated critical values for Overweight/Obesity, Hypertension, Cholesterol, and Diabetes.  Discusses basics of heart failure: signs/symptoms and treatments.  Introduces Heart Failure Zone chart for action plan for heart failure. Written material provided at class time.   Infection Prevention: - Provides verbal and written material to individual with discussion of infection control including proper hand washing and proper equipment cleaning during  exercise session. Flowsheet Row Cardiac Rehab from 11/02/2023 in [facility] Cardiac and Pulmonary Rehab  Date 11/02/23  Educator [facility]  Instruction Review Code 1- Verbalizes Understanding    Falls Prevention: - Provides verbal and written material to individual with discussion of falls prevention and safety. Flowsheet Row Cardiac Rehab from 11/02/2023 in [facility] Cardiac and Pulmonary Rehab  Date 11/02/23  Educator [facility]  Instruction Review Code 1- Verbalizes Understanding    Other: -Provides group and verbal instruction on various topics (see comments)   Knowledge Questionnaire Score:   Core Components/Risk Factors/Patient Goals at Admission:  Personal Goals and Risk Factors at Admission - 10/27/23 1426       Core Components/Risk Factors/Patient Goals on Admission    Weight Management Yes;Weight Loss    Intervention Weight Management: Develop a combined nutrition and exercise program designed to reach desired caloric intake, while maintaining appropriate intake of nutrient and fiber, sodium and fats, and appropriate energy expenditure required for the weight goal.;Weight Management: Provide education and appropriate resources to help participant work on and attain dietary goals.;Weight Management/Obesity: Establish reasonable short term and long term weight goals.    Expected Outcomes Short Term: Continue to assess and modify interventions until short term weight is achieved;Weight Loss: Understanding of general recommendations for a balanced deficit meal plan, which promotes 1-2 lb weight loss per week and includes a negative energy balance of [id number] kcal/d;Understanding recommendations for meals to include 15-35% energy as protein, 25-35% energy from fat, 35-60% energy from carbohydrates, less than 200mg  of dietary cholesterol, 20-35 gm of total fiber daily;Understanding of distribution of calorie intake throughout the day with the consumption of 4-5 meals/snacks    Hypertension Yes  Intervention  Provide education on lifestyle modifcations including regular physical activity/exercise, weight management, moderate sodium restriction and increased consumption of fresh fruit, vegetables, and low fat dairy, alcohol moderation, and smoking cessation.;Monitor prescription use compliance.    Expected Outcomes Short Term: Continued assessment and intervention until BP is < 140/41mm HG in hypertensive participants. < 130/40mm HG in hypertensive participants with diabetes, heart failure or chronic kidney disease.;Long Term: Maintenance of blood pressure at goal levels.          Education:Diabetes - Individual verbal and written instruction to review signs/symptoms of diabetes, desired ranges of glucose level fasting, after meals and with exercise. Acknowledge that pre and post exercise glucose checks will be done for 3 sessions at entry of program.   Core Components/Risk Factors/Patient Goals Review:   Goals and Risk Factor Review     Row Name 12/13/23 1725 01/06/24 1732 02/14/24 1753         Core Components/Risk Factors/Patient Goals Review   Personal Goals Review Weight Management/Obesity;Hypertension Other;Weight Management/Obesity Other     Review Jhana states that she has not been checking BP at home because her blood pressure cuff had dead batteries. She states that she has recently ordered batteries and plans to start checking BP at home once she gets them. Patria states that she has gained some weight from when she was sick but feels that a lot of that is muscle as she states she is feeling stronger and feels like she has more musculare strength. More recently her weight has been steady, which she is happy with. Janayia was out for two weeks on vacation and came back without weight gain. Improving Strength and stamina is one of the most important things to her. Informed her that she is doing well and increasing her loads on all her machines. Rhythm would like to come off her Plavix  if she can. She  informed staff that she has a follow up in December. Informed her to ask her doctor if she could come off or reduce the dose if she is able to.     Expected Outcomes Short: get BP cuff working at home and start checking BP at home. Long: control cardiac risk factors. Short: improve strength and stamina. Long: continue to workout independently post [facility]. Short: speak with Dr about Plavix . Long: take medications as prescribed.        Core Components/Risk Factors/Patient Goals at Discharge (Final Review):   Goals and Risk Factor Review - 02/14/24 1753       Core Components/Risk Factors/Patient Goals Review   Personal Goals Review Other    Review Kyaira would like to come off her Plavix  if she can. She informed staff that she has a follow up in December. Informed her to ask her doctor if she could come off or reduce the dose if she is able to.    Expected Outcomes Short: speak with Dr about Plavix . Long: take medications as prescribed.          ITP Comments:  ITP Comments     Row Name 10/27/23 1428 11/02/23 1508 11/08/23 1718 11/24/23 0742 12/22/23 0801   ITP Comments Virtual Visit completed. Patient informed on EP and RD appointment and 6 Minute walk test. Patient also informed of patient health questionnaires on My Chart. Patient Verbalizes understanding. Visit diagnosis can be found in [facility] 10/15/2023. Completed and gym orientation for cardiac rehab. Initial ITP created and sent for review to Dr. Oneil Pinal, Medical Director. First full day of  exercise!  Patient was oriented to gym and equipment including functions, settings, policies, and procedures.  Patient's individual exercise prescription and treatment plan were reviewed.  All starting workloads were established based on the results of the 6 minute walk test done at initial orientation visit.  The plan for exercise progression was also introduced and progression will be customized based on patient's performance and goals. 30 Day  review completed. Medical Director ITP review done, changes made as directed, and signed approval by Medical Director. New to program. 30 Day review completed. Medical Director ITP review done; changes made as directed and signed approval by Medical Director.    Row Name 01/19/24 1000 02/16/24 0822         ITP Comments 30 Day review completed. Medical Director ITP review done; changes made as directed and signed approval by Medical Director. 30 Day review completed. Medical Director ITP review done; changes made as directed and signed approval by Medical Director.         Comments: 30 day review

## 2024-02-17 ENCOUNTER — Encounter

## 2024-02-21 ENCOUNTER — Encounter

## 2024-02-23 ENCOUNTER — Encounter

## 2024-02-24 ENCOUNTER — Encounter

## 2024-02-28 ENCOUNTER — Encounter

## 2024-03-01 ENCOUNTER — Encounter: Attending: Internal Medicine

## 2024-03-01 DIAGNOSIS — I214 Non-ST elevation (NSTEMI) myocardial infarction: Secondary | ICD-10-CM | POA: Insufficient documentation

## 2024-03-02 ENCOUNTER — Encounter

## 2024-03-06 ENCOUNTER — Telehealth: Payer: Self-pay | Admitting: *Deleted

## 2024-03-06 ENCOUNTER — Encounter

## 2024-03-06 NOTE — Telephone Encounter (Signed)
 [facility] and left a message to see if she was planning to return to finish the program. Will follow up in one week if she does not return call.

## 2024-03-08 ENCOUNTER — Encounter

## 2024-03-08 DIAGNOSIS — I214 Non-ST elevation (NSTEMI) myocardial infarction: Secondary | ICD-10-CM | POA: Diagnosis present

## 2024-03-08 NOTE — Progress Notes (Signed)
 Daily Session Note  Patient Details  Name: Angel Douglas MRN: [id number] Date of Birth: 1960/11/12 Referring Provider:   Flowsheet Row Cardiac Rehab from 11/02/2023 in [facility] Cardiac and Pulmonary Rehab  Referring Provider Dr. Cara Lovelace    Encounter Date: 03/08/2024  Check In:  Session Check In - 03/08/24 1716       Check-In   Supervising physician immediately available to respond to emergencies See telemetry face sheet for immediately available ER MD    Location [facility]-Cardiac & Pulmonary Rehab    Staff Present Burnard Davenport RN,BSN,MPA;[facility] BS, ACSM CEP, Exercise Physiologist    Virtual Visit No    Medication changes reported     No    Fall or balance concerns reported    No    Tobacco Cessation No Change    Warm-up and Cool-down Performed on first and last piece of equipment    Resistance Training Performed Yes    VAD Patient? No    PAD/SET Patient? No      Pain Assessment   Currently in Pain? No/denies             Social History   Tobacco Use  Smoking Status Former   Types: Cigarettes   Start date: 10/27/1998   Quit date: 10/27/1983   Years since quitting: 40.3  Smokeless Tobacco Never  Tobacco Comments   Quit 25ya, 15 pack year    Goals Met:  Independence with exercise equipment Exercise tolerated well No report of concerns or symptoms today Strength training completed today  Goals Unmet:  Not Applicable  Comments: Pt able to follow exercise prescription today without complaint.  Will continue to monitor for progression.    Dr. Oneil Pinal is Medical Director for [facility] Cardiac Rehabilitation.  Dr. Fuad Aleskerov is Medical Director for [facility] Pulmonary Rehabilitation.

## 2024-03-09 ENCOUNTER — Encounter: Admitting: Emergency Medicine

## 2024-03-09 DIAGNOSIS — I214 Non-ST elevation (NSTEMI) myocardial infarction: Secondary | ICD-10-CM

## 2024-03-09 NOTE — Progress Notes (Signed)
 Daily Session Note  Patient Details  Name: Angel Douglas MRN: [id number] Date of Birth: 02-07-1961 Referring Provider:   Flowsheet Row Cardiac Rehab from 11/02/2023 in [facility] Cardiac and Pulmonary Rehab  Referring Provider Dr. Cara Lovelace    Encounter Date: 03/09/2024  Check In:  Session Check In - 03/09/24 1732       Check-In   Supervising physician immediately available to respond to emergencies See telemetry face sheet for immediately available ER MD    Location [facility]-Cardiac & Pulmonary Rehab    Staff Present Leita Franks RN,BSN;[facility] BS, Exercise Physiologist;Margaret Best, MS, Exercise Physiologist    Virtual Visit No    Medication changes reported     No    Fall or balance concerns reported    No    Tobacco Cessation No Change    Warm-up and Cool-down Performed on first and last piece of equipment    Resistance Training Performed Yes    VAD Patient? No    PAD/SET Patient? No      Pain Assessment   Currently in Pain? No/denies             Social History   Tobacco Use  Smoking Status Former   Types: Cigarettes   Start date: 10/27/1998   Quit date: 10/27/1983   Years since quitting: 40.3  Smokeless Tobacco Never  Tobacco Comments   Quit 25ya, 15 pack year    Goals Met:  Independence with exercise equipment Exercise tolerated well No report of concerns or symptoms today Strength training completed today  Goals Unmet:  Not Applicable  Comments: Pt able to follow exercise prescription today without complaint.  Will continue to monitor for progression.    Dr. Oneil Pinal is Medical Director for [facility] Cardiac Rehabilitation.  Dr. Fuad Aleskerov is Medical Director for [facility] Pulmonary Rehabilitation.

## 2024-03-13 ENCOUNTER — Encounter

## 2024-03-15 ENCOUNTER — Encounter

## 2024-03-15 ENCOUNTER — Encounter: Payer: Self-pay | Admitting: *Deleted

## 2024-03-15 DIAGNOSIS — I214 Non-ST elevation (NSTEMI) myocardial infarction: Secondary | ICD-10-CM

## 2024-03-15 NOTE — Progress Notes (Signed)
 Cardiac Individual Treatment Plan  Patient Details  Name: Liesa Tsan MRN: [id number] Date of Birth: [birth year] Referring Provider:   Flowsheet Row Cardiac Rehab from 11/02/2023 in [facility] Cardiac and Pulmonary Rehab  Referring Provider Dr. Cara Lovelace    Initial Encounter Date:  Flowsheet Row Cardiac Rehab from 11/02/2023 in [facility] Cardiac and Pulmonary Rehab  Date 11/02/23    Visit Diagnosis: NSTEMI (non-ST elevated myocardial infarction) [facility])  Patient's Home Medications on Admission:  Current Outpatient Medications:    acetaminophen  (TYLENOL ) 325 MG tablet, Take 2 tablets (650 mg total) by mouth every 6 (six) hours as needed for mild pain (or Fever >/= 101)., Disp: 20 tablet, Rfl: 0   ALLEGRA-D ALLERGY & CONGESTION 180-240 MG 24 hr tablet, Take 1 tablet by mouth daily., Disp: , Rfl:    aspirin  EC 81 MG tablet, Take 1 tablet (81 mg total) by mouth daily. Swallow whole., Disp: 30 tablet, Rfl: 11   buPROPion (WELLBUTRIN XL) 300 MG 24 hr tablet, Take 300 mg by mouth daily., Disp: , Rfl:    estradiol (ESTRACE) 0.1 MG/GM vaginal cream, Place vaginally., Disp: , Rfl:    isosorbide  mononitrate (IMDUR ) 30 MG 24 hr tablet, Take 1 tablet (30 mg total) by mouth daily. (Patient not taking: Reported on 10/27/2023), Disp: 30 tablet, Rfl: 11   metoprolol  succinate (TOPROL -XL) 25 MG 24 hr tablet, Take 0.5 tablets (12.5 mg total) by mouth daily. (Patient not taking: Reported on 10/27/2023), Disp: 15 tablet, Rfl: 11   MOUNJARO 15 MG/0.5ML Pen, , Disp: , Rfl:    Multiple Vitamin (MULTI-VITAMINS) TABS, Take by mouth., Disp: , Rfl:    rosuvastatin  (CRESTOR ) 5 MG tablet, Take 1 tablet (5 mg total) by mouth daily., Disp: 30 tablet, Rfl: 11  Past Medical History: Past Medical History:  Diagnosis Date   Allergy    Depression    Hypertension    Preeclampsia     Tobacco Use: Social History   Tobacco Use  Smoking Status Former   Types: Cigarettes   Start date: 10/27/1998   Quit date: 10/27/1983    Years since quitting: 40.4  Smokeless Tobacco Never  Tobacco Comments   Quit 25ya, 15 pack year    Labs: Review Flowsheet       Latest Ref Rng & Units 10/16/2023  Labs for ITP Cardiac and Pulmonary Rehab  Cholestrol 0 - 200 mg/dL 845   LDL (calc) 0 - 99 mg/dL 92   HDL-C >59 mg/dL 54   Trlycerides <849 mg/dL 42      Exercise Target Goals: Exercise Program Goal: Individual exercise prescription set using results from initial 6 min walk test and THRR while considering  patient's activity barriers and safety.   Exercise Prescription Goal: Initial exercise prescription builds to 30-45 minutes a day of aerobic activity, 2-3 days per week.  Home exercise guidelines will be given to patient during program as part of exercise prescription that the participant will acknowledge.   Education: Aerobic Exercise: - Group verbal and visual presentation on the components of exercise prescription. Introduces F.I.T.T principle from ACSM for exercise prescriptions.  Reviews F.I.T.T. principles of aerobic exercise including progression. Written material provided at class time.   Education: Resistance Exercise: - Group verbal and visual presentation on the components of exercise prescription. Introduces F.I.T.T principle from ACSM for exercise prescriptions  Reviews F.I.T.T. principles of resistance exercise including progression. Written material provided at class time.    Education: Exercise & Equipment Safety: - Individual verbal instruction and demonstration of  equipment use and safety with use of the equipment. Flowsheet Row Cardiac Rehab from 11/02/2023 in [facility] Cardiac and Pulmonary Rehab  Date 11/02/23  Educator [facility]  Instruction Review Code 1- Verbalizes Understanding    Education: Exercise Physiology & General Exercise Guidelines: - Group verbal and written instruction with models to review the exercise physiology of the cardiovascular system and associated critical values. Provides general  exercise guidelines with specific guidelines to those with heart or lung disease. Written material provided at class time.   Education: Flexibility, Balance, Mind/Body Relaxation: - Group verbal and visual presentation with interactive activity on the components of exercise prescription. Introduces F.I.T.T principle from ACSM for exercise prescriptions. Reviews F.I.T.T. principles of flexibility and balance exercise training including progression. Also discusses the mind body connection.  Reviews various relaxation techniques to help reduce and manage stress (i.e. Deep breathing, progressive muscle relaxation, and visualization). Balance handout provided to take home. Written material provided at class time.   Activity Barriers & Risk Stratification:  Activity Barriers & Cardiac Risk Stratification - 11/02/23 1510       Activity Barriers & Cardiac Risk Stratification   Activity Barriers None    Cardiac Risk Stratification Moderate          6 Minute Walk:  6 Minute Walk     Row Name 11/02/23 1509 02/02/24 1734       6 Minute Walk   Phase Initial Discharge    Distance 1440 feet 1605 feet    Distance % Change -- 11 %    Distance Feet Change -- 165 ft    Walk Time 6 minutes 6 minutes    # of Rest Breaks 0 0    MPH 2.73 3.04    METS 3.43 3.8    RPE 7 10    Perceived Dyspnea  0 0    VO2 Peak 12 13.32    Symptoms No No    Resting HR 64 bpm 74 bpm    Resting BP 106/64 128/82    Resting Oxygen Saturation  96 % --    Exercise Oxygen Saturation  during 6 min walk 96 % --    Max Ex. HR 101 bpm 103 bpm    Max Ex. BP 124/66 142/80    2 Minute Post BP 114/60 124/80       Oxygen Initial Assessment:   Oxygen Re-Evaluation:   Oxygen Discharge (Final Oxygen Re-Evaluation):   Initial Exercise Prescription:  Initial Exercise Prescription - 11/02/23 1500       Date of Initial Exercise RX and Referring Provider   Date 11/02/23    Referring Provider Dr. Cara Lovelace       Oxygen   Maintain Oxygen Saturation 88% or higher      Treadmill   MPH 2.7    Grade 0    Minutes 15    METs 3.07      NuStep   Level 2    SPM 80    Minutes 15    METs 3.43      REL-XR   Level 2    Watts 25    Speed 50    Minutes 15    METs 3.43      T5 Nustep   Level 2   T6   SPM 80    Minutes 15    METs 3.43      Prescription Details   Duration Progress to 30 minutes of continuous aerobic without signs/symptoms of physical distress  Intensity   THRR 40-80% of Max Heartrate 101-139    Ratings of Perceived Exertion 11-13    Perceived Dyspnea 0-4      Progression   Progression Continue to progress workloads to maintain intensity without signs/symptoms of physical distress.      Resistance Training   Training Prescription Yes    Weight 7lb    Reps 10-15          Perform Capillary Blood Glucose checks as needed.  Exercise Prescription Changes:   Exercise Prescription Changes     Row Name 11/02/23 1500 11/19/23 1000 12/01/23 1500 12/06/23 1700 12/13/23 1600     Response to Exercise   Blood Pressure (Admit) 106/64 108/72 118/58 -- 120/60   Blood Pressure (Exercise) 124/66 140/58 156/82 -- --   Blood Pressure (Exit) 114/66 96/70 118/64 -- 100/62   Heart Rate (Admit) 64 bpm 72 bpm 70 bpm -- 70 bpm   Heart Rate (Exercise) 101 bpm 122 bpm 131 bpm -- 124 bpm   Heart Rate (Exit) 66 bpm 90 bpm 74 bpm -- 88 bpm   Oxygen Saturation (Admit) 96 % -- -- -- --   Oxygen Saturation (Exercise) 96 % -- -- -- --   Oxygen Saturation (Exit) 96 % -- -- -- --   Rating of Perceived Exertion (Exercise) 7 15 15  -- 15   Perceived Dyspnea (Exercise) 0 0 0 -- 0   Symptoms none none none -- none   Comments results first 2 weeks of exercise -- -- --   Duration -- Progress to 30 minutes of  aerobic without signs/symptoms of physical distress Progress to 30 minutes of  aerobic without signs/symptoms of physical distress Progress to 30 minutes of  aerobic without  signs/symptoms of physical distress Continue with 30 min of aerobic exercise without signs/symptoms of physical distress.   Intensity -- THRR unchanged THRR unchanged THRR unchanged THRR unchanged     Progression   Progression -- Continue to progress workloads to maintain intensity without signs/symptoms of physical distress. Continue to progress workloads to maintain intensity without signs/symptoms of physical distress. Continue to progress workloads to maintain intensity without signs/symptoms of physical distress. Continue to progress workloads to maintain intensity without signs/symptoms of physical distress.   Average METs -- 5.2 5.38 5.38 4.94     Resistance Training   Training Prescription -- Yes Yes Yes Yes   Weight -- 7lb 7lb 7lb 3 lb   Reps -- 10-15 10-15 10-15 10-15     Interval Training   Interval Training -- No No No No     Treadmill   MPH -- 3.3 3 3  3.4   Grade -- 5 8 8 7    Minutes -- 15 15 15 15    METs -- 5.8 6.61 6.61 6.88     NuStep   Level -- 2 2 2 4   T6: 4   Minutes -- 15 15 15 15    METs -- 3.1 3.7 3.7 4  T6: 2.5     REL-XR   Level -- 5 10 10 7    Minutes -- 15 15 15 15    METs -- 6.8 7.3 7.3 6     T5 Nustep   Level -- -- 2 2 --   Minutes -- -- 15 15 --   METs -- -- 3 3 --     Rower   Level -- -- 3 3 2    Watts -- -- 41 41 39   Minutes -- -- 15 15 15  METs -- -- 4.37 4.37 5.57     Home Exercise Plan   Plans to continue exercise at -- -- -- [employer] (comment) [employer] (comment)   Frequency -- -- -- Add 2 additional days to program exercise sessions. Add 2 additional days to program exercise sessions.   Initial Home Exercises Provided -- -- -- 12/06/23 12/06/23     Oxygen   Maintain Oxygen Saturation -- 88% or higher 88% or higher 88% or higher 88% or higher    Row Name 12/29/23 1000 01/11/24 1400 01/25/24 0900 02/09/24 1600 02/24/24 1000     Response to Exercise   Blood Pressure (Admit) 106/62 104/60 108/68 128/82 104/62    Blood Pressure (Exit) 102/62 106/60 120/80 110/62 98/60   Heart Rate (Admit) 60 bpm 80 bpm 78 bpm 81 bpm 71 bpm   Heart Rate (Exercise) 135 bpm 137 bpm 1414 bpm 144 bpm 130 bpm   Heart Rate (Exit) 85 bpm 90 bpm 94 bpm 99 bpm 83 bpm   Rating of Perceived Exertion (Exercise) 13 13 15 15 15    Symptoms none none none none none   Duration Continue with 30 min of aerobic exercise without signs/symptoms of physical distress. Continue with 30 min of aerobic exercise without signs/symptoms of physical distress. Continue with 30 min of aerobic exercise without signs/symptoms of physical distress. Continue with 30 min of aerobic exercise without signs/symptoms of physical distress. Continue with 30 min of aerobic exercise without signs/symptoms of physical distress.   Intensity THRR unchanged THRR unchanged THRR unchanged THRR unchanged THRR unchanged     Progression   Progression Continue to progress workloads to maintain intensity without signs/symptoms of physical distress. Continue to progress workloads to maintain intensity without signs/symptoms of physical distress. Continue to progress workloads to maintain intensity without signs/symptoms of physical distress. Continue to progress workloads to maintain intensity without signs/symptoms of physical distress. Continue to progress workloads to maintain intensity without signs/symptoms of physical distress.   Average METs 6.02 5.87 5.77 6.27 6.09     Resistance Training   Training Prescription Yes Yes Yes Yes Yes   Weight 3 lb 3 lb 3 lb 3 lb 3 lb   Reps 10-15 10-15 10-15 10-15 10-15     Interval Training   Interval Training No No No No No     Treadmill   MPH 3.1 3.3 3.4 3.1 3   Grade 6 5 7  7.5 6   Minutes 15 15 15 15 15    METs 5.95 5.8 6.88 6.58 5.78     NuStep   Level 5 -- -- -- --   Minutes 25 -- -- -- --   METs 7.4 -- -- -- --     REL-XR   Level 6 -- -- -- --   Minutes 15 -- -- -- --   METs 5.7 -- -- -- --     Rower   Level 10 6 10  10 10    Watts 61 62 60 62 58   Minutes 15 15 15 15 15    METs 6.67 6.59 6.53 6.66 6.47     Home Exercise Plan   Plans to continue exercise at [employer] (comment) [employer] (comment) [employer] (comment) [employer] (comment) [employer] (comment)   Frequency Add 2 additional days to program exercise sessions. Add 2 additional days to program exercise sessions. Add 2 additional days to program exercise sessions. Add 2 additional days to program exercise sessions. Add 2 additional days to program  exercise sessions.   Initial Home Exercises Provided 12/06/23 12/06/23 12/06/23 12/06/23 12/06/23     Oxygen   Maintain Oxygen Saturation 88% or higher 88% or higher 88% or higher 88% or higher 88% or higher      Exercise Comments:   Exercise Comments     Row Name 11/08/23 1719           Exercise Comments First full day of exercise!  Patient was oriented to gym and equipment including functions, settings, policies, and procedures.  Patient's individual exercise prescription and treatment plan were reviewed.  All starting workloads were established based on the results of the 6 minute walk test done at initial orientation visit.  The plan for exercise progression was also introduced and progression will be customized based on patient's performance and goals.          Exercise Goals and Review:   Exercise Goals     Row Name 11/02/23 1513             Exercise Goals   Increase Physical Activity Yes       Intervention Provide advice, education, support and counseling about physical activity/exercise needs.;Develop an individualized exercise prescription for aerobic and resistive training based on initial evaluation findings, risk stratification, comorbidities and participant's personal goals.       Expected Outcomes Short Term: Attend rehab on a regular basis to increase amount of physical activity.;Long Term: Add in home exercise to make exercise part  of routine and to increase amount of physical activity.;Long Term: Exercising regularly at least 3-5 days a week.       Increase Strength and Stamina Yes       Intervention Provide advice, education, support and counseling about physical activity/exercise needs.;Develop an individualized exercise prescription for aerobic and resistive training based on initial evaluation findings, risk stratification, comorbidities and participant's personal goals.       Expected Outcomes Short Term: Increase workloads from initial exercise prescription for resistance, speed, and METs.;Short Term: Perform resistance training exercises routinely during rehab and add in resistance training at home;Long Term: Improve cardiorespiratory fitness, muscular endurance and strength as measured by increased METs and functional capacity ( )       Able to understand and use rate of perceived exertion (RPE) scale Yes       Intervention Provide education and explanation on how to use RPE scale       Expected Outcomes Short Term: Able to use RPE daily in rehab to express subjective intensity level;Long Term:  Able to use RPE to guide intensity level when exercising independently       Able to understand and use Dyspnea scale Yes       Intervention Provide education and explanation on how to use Dyspnea scale       Expected Outcomes Long Term: Able to use Dyspnea scale to guide intensity level when exercising independently;Short Term: Able to use Dyspnea scale daily in rehab to express subjective sense of shortness of breath during exertion       Knowledge and understanding of Target Heart Rate Range (THRR) Yes       Intervention Provide education and explanation of THRR including how the numbers were predicted and where they are located for reference       Expected Outcomes Short Term: Able to state/look up THRR;Short Term: Able to use daily as guideline for intensity in rehab;Long Term: Able to use THRR to govern intensity when  exercising independently  Able to check pulse independently Yes       Intervention Provide education and demonstration on how to check pulse in carotid and radial arteries.;Review the importance of being able to check your own pulse for safety during independent exercise       Expected Outcomes Short Term: Able to explain why pulse checking is important during independent exercise;Long Term: Able to check pulse independently and accurately       Understanding of Exercise Prescription Yes       Intervention Provide education, explanation, and written materials on patient's individual exercise prescription       Expected Outcomes Short Term: Able to explain program exercise prescription;Long Term: Able to explain home exercise prescription to exercise independently          Exercise Goals Re-Evaluation :  Exercise Goals Re-Evaluation     Row Name 11/08/23 1719 11/19/23 1029 12/01/23 1557 12/06/23 1736 12/13/23 1608     Exercise Goal Re-Evaluation   Exercise Goals Review Increase Physical Activity;Knowledge and understanding of Target Heart Rate Range (THRR);Able to understand and use rate of perceived exertion (RPE) scale;Understanding of Exercise Prescription;Increase Strength and Stamina;Able to check pulse independently Increase Physical Activity;Increase Strength and Stamina;Understanding of Exercise Prescription Increase Physical Activity;Increase Strength and Stamina;Understanding of Exercise Prescription Increase Physical Activity;Increase Strength and Stamina;Able to understand and use rate of perceived exertion (RPE) scale;Able to understand and use Dyspnea scale;Knowledge and understanding of Target Heart Rate Range (THRR);Able to check pulse independently;Understanding of Exercise Prescription Increase Physical Activity;Increase Strength and Stamina;Understanding of Exercise Prescription   Comments Reviewed RPE and dyspnea scale, THR and program prescription with pt today.  Pt voiced  understanding and was given a copy of goals to take home. Jalayiah is off to a great start in rehab. She was able to attend her first 3 sessions during this weeks review period. During these sessions she was able to increase from 2.7 to 3. on the treadmill, and increase to level 5 on the XR. We will continue to monitor her progress in the program. Itzel continues to do well in rehab. She was recently able to increase her treadmill incline from 5% to 8%, while maintaining a speed of . She was also able to increase from level 5 to level 10 on the XR. We will continue to monitor her progress in the program. Reviewed home exercise with pt today.  Pt plans to go to her community gym for exercise.  Reviewed THR, pulse, RPE, sign and symptoms, pulse oximetery and when to call 911 or MD.  Also discussed weather considerations and indoor options.  Pt voiced understanding. Charmin continues to do well in rehab. She recently increased her treadmill workload to a speed of 3.4 mph with an incline of 7%. She also improved to level 4 on the T4 nustep. She did decrease from 7 lb to 3 lb handweights for resistance training as well. We will continue to monitor her progress in the program.   Expected Outcomes Short: Use RPE daily to regulate intensity.  Long: Follow program prescription in THR. Short: Continue to follow exercise prescription. Long: Continue exercise to improve strength and stamina. Short: Continue to increase treadmill workload. Long: Continue exercise to improev strength and stamina. Short: add 1-2 days per week of exercise on off days of cardiac rehab. Long: maintain independent exercise routine upon graduation from cardiac rehab. Short: Increase back up to 7 lb hand weights for resistance training. Long: Continue exercise to increase strength and stamina.  Row Name 12/29/23 1029 01/11/24 1458 01/25/24 0931 01/31/24 1732 02/02/24 1735     Exercise Goal Re-Evaluation   Exercise Goals Review Increase Physical  Activity;Increase Strength and Stamina;Understanding of Exercise Prescription Increase Physical Activity;Increase Strength and Stamina;Understanding of Exercise Prescription Increase Physical Activity;Increase Strength and Stamina;Understanding of Exercise Prescription Increase Physical Activity;Increase Strength and Stamina;Understanding of Exercise Prescription Increase Physical Activity;Increase Strength and Stamina;Understanding of Exercise Prescription   Comments Lusine continues to make improvements in rehab. She has been able to increase from level 4 to 5 on the T4 nustep, as well as increase from level 2 to 10 on the rower. We will continue to monitor her progress in the program. Jalasia continues to do well in rehab. She recently increased her speed on the treadmill to 3.3 mph while maintaining an incline of 5%. Her workload on the rower also decreased from level 10 to level 6 since the last review. We will continue to monitor her progress in the program. Etty continues to do well in rehab. She recently increased her speed on the treadmill back up to to 3.4 mph with an incline of 7%. She also improved her level on the rower back up to level 10. We will continue to monitor her progress in the program. Home exercise follow up done today. Tashari reports that she has added one day a week of exercise at home in addtion to her 3 days a week in cardiac rehab. She is using a engineer, drilling and walking. She reports that she is tolerating her independent exercise well with no conerns to report. Koral improved on her 6 MWT by 11%! She will graduate from cardiac rehab in the near future and plans to use a community gym to continue with her exercise routine.   Expected Outcomes Short: Continue to increase workload on the rowing machine. Long: Continue exercise to improve strength and stamina. Short: Increase workload on the Rower back up to level 10. Long: Continue exercise to improve strength and stamina. Short: Continue to  progressively increase treadmill workload. Long: Continue exercise to improve strength and stamina. Short: continue to consistently exercise one day a week outside of class, and progress to 2. Long: maintain independent exercise routine upon graduation from cardiac rehab. Short: graduate Long: maintain independent exercise routine.    Row Name 02/09/24 1621 02/24/24 1005 03/08/24 1121         Exercise Goal Re-Evaluation   Exercise Goals Review Increase Physical Activity;Increase Strength and Stamina;Understanding of Exercise Prescription Increase Physical Activity;Increase Strength and Stamina;Understanding of Exercise Prescription Increase Physical Activity;Increase Strength and Stamina;Understanding of Exercise Prescription     Comments Annakate continues to do well in rehab and she will graduate soon. She improved by 11% on her post with a total of 1605 feet. She also increased her incline on the treadmill to 7.5% while maintaining a speed of 3.1 mph. She increased her average watts on the rower to 62 watts at level 10. We will continue to monitor her progress in the program until graduation. Kelce continues to do well in rehab and she will graduate soon. She improved by 11% on her post . She continues to walk on the treadmill at a speed of 3 mph with an incline of 6%. We will continue to monitor her progress in the program until graduation. Jolan has not attended rehab since the last review. We will call her to see when she plans to return to the program. We will continue to monitor her progress until  she graduates from the program.     Expected Outcomes Short: Graduate. Long: Continue to exercise independently. Short: Graduate. Long: Continue to exercise independently. Short: Return to rehab when appropriate. Long: Graduate.        Discharge Exercise Prescription (Final Exercise Prescription Changes):  Exercise Prescription Changes - 02/24/24 1000       Response to Exercise   Blood Pressure  (Admit) 104/62    Blood Pressure (Exit) 98/60    Heart Rate (Admit) 71 bpm    Heart Rate (Exercise) 130 bpm    Heart Rate (Exit) 83 bpm    Rating of Perceived Exertion (Exercise) 15    Symptoms none    Duration Continue with 30 min of aerobic exercise without signs/symptoms of physical distress.    Intensity THRR unchanged      Progression   Progression Continue to progress workloads to maintain intensity without signs/symptoms of physical distress.    Average METs 6.09      Resistance Training   Training Prescription Yes    Weight 3 lb    Reps 10-15      Interval Training   Interval Training No      Treadmill   MPH 3    Grade 6    Minutes 15    METs 5.78      Rower   Level 10    Watts 58    Minutes 15    METs 6.47      Home Exercise Plan   Plans to continue exercise at [employer] (comment)    Frequency Add 2 additional days to program exercise sessions.    Initial Home Exercises Provided 12/06/23      Oxygen   Maintain Oxygen Saturation 88% or higher          Nutrition:  Target Goals: Understanding of nutrition guidelines, daily intake of sodium 1500mg , cholesterol 200mg , calories 30% from fat and 7% or less from saturated fats, daily to have 5 or more servings of fruits and vegetables.  Education: Nutrition 1 -Group instruction provided by verbal, written material, interactive activities, discussions, models, and posters to present general guidelines for heart healthy nutrition including macronutrients, label reading, and promoting whole foods over processed counterparts. Education serves as pensions consultant of discussion of heart healthy eating for all. Written material provided at class time.    Education: Nutrition 2 -Group instruction provided by verbal, written material, interactive activities, discussions, models, and posters to present general guidelines for heart healthy nutrition including sodium, cholesterol, and saturated fat. Providing guidance  of habit forming to improve blood pressure, cholesterol, and body weight. Written material provided at class time.     Biometrics:  Pre Biometrics - 11/02/23 1514       Pre Biometrics   Height 5' (1.524 m)    Weight 137 lb (62.1 kg)    Waist Circumference 30 inches    Hip Circumference 39 inches    Waist to Hip Ratio 0.77 %    BMI (Calculated) 26.76    Single Leg Stand 22 seconds          Post Biometrics - 02/02/24 1736        Post  Biometrics   Height 5' (1.524 m)    Weight 144 lb 6.4 oz (65.5 kg)    Waist Circumference 33.5 inches    Hip Circumference 39 inches    Waist to Hip Ratio 0.86 %    BMI (Calculated) 28.2    Single Leg Stand 26.47  seconds          Nutrition Therapy Plan and Nutrition Goals:  Nutrition Therapy & Goals - 12/13/23 1724       Nutrition Therapy   RD appointment deferred Yes          Nutrition Assessments:  MEDIFICTS Score Key: >=70 Need to make dietary changes  40-70 Heart Healthy Diet <= 40 Therapeutic Level Cholesterol Diet   Picture Your Plate Scores: <59 Unhealthy dietary pattern with much room for improvement. 41-50 Dietary pattern unlikely to meet recommendations for good health and room for improvement. 51-60 More healthful dietary pattern, with some room for improvement.  >60 Healthy dietary pattern, although there may be some specific behaviors that could be improved.    Nutrition Goals Re-Evaluation:  Nutrition Goals Re-Evaluation     Row Name 12/13/23 1724 01/06/24 1731 02/14/24 1750 03/08/24 1730       Goals   Comment Continues to defer RD apt. Patient deferred RD appointment. Patient deferred RD appointment. Patient deferred RD appointment.       Nutrition Goals Discharge (Final Nutrition Goals Re-Evaluation):  Nutrition Goals Re-Evaluation - 03/08/24 1730       Goals   Comment Patient deferred RD appointment.          Psychosocial: Target Goals: Acknowledge presence or absence of significant  depression and/or stress, maximize coping skills, provide positive support system. Participant is able to verbalize types and ability to use techniques and skills needed for reducing stress and depression.   Education: Stress, Anxiety, and Depression - Group verbal and visual presentation to define topics covered.  Reviews how body is impacted by stress, anxiety, and depression.  Also discusses healthy ways to reduce stress and to treat/manage anxiety and depression. Written material provided at class time.   Education: Sleep Hygiene -Provides group verbal and written instruction about how sleep can affect your health.  Define sleep hygiene, discuss sleep cycles and impact of sleep habits. Review good sleep hygiene tips.   Initial Review & Psychosocial Screening:  Initial Psych Review & Screening - 10/27/23 1430       Initial Review   Current issues with Current Depression;History of Depression      Family Dynamics   Good Support System? Yes    Comments Her depression started with family seperation and the climate that we live in right now. She can look to her daughter, friend and therapis for support.      Barriers   Psychosocial barriers to participate in program The patient should benefit from training in stress management and relaxation.;There are no identifiable barriers or psychosocial needs.      Screening Interventions   Interventions Provide feedback about the scores to participant;To provide support and resources with identified psychosocial needs;Encouraged to exercise    Expected Outcomes Short Term goal: Utilizing psychosocial counselor, staff and physician to assist with identification of specific Stressors or current issues interfering with healing process. Setting desired goal for each stressor or current issue identified.;Long Term Goal: Stressors or current issues are controlled or eliminated.;Short Term goal: Identification and review with participant of any Quality of  Life or Depression concerns found by scoring the questionnaire.;Long Term goal: The participant improves quality of Life and PHQ9 Scores as seen by post scores and/or verbalization of changes          Quality of Life Scores:   Scores of 19 and below usually indicate a poorer quality of life in these areas.  A difference of  2-3 points is a clinically meaningful difference.  A difference of 2-3 points in the total score of the Quality of Life Index has been associated with significant improvement in overall quality of life, self-image, physical symptoms, and general health in studies assessing change in quality of life.  PHQ-9: Review Flowsheet       11/02/2023  Depression screen PHQ 2/9  Decreased Interest 0  Down, Depressed, Hopeless 0  PHQ - 2 Score 0  Altered sleeping 0  Tired, decreased energy 1  Change in appetite 0  Feeling bad or failure about yourself  0  Trouble concentrating 0  Moving slowly or fidgety/restless 0  Suicidal thoughts 0  PHQ-9 Score 1   Difficult doing work/chores Not difficult at all    Details       Data saved with a previous flowsheet row definition        Interpretation of Total Score  Total Score Depression Severity:  1-4 = Minimal depression, 5-9 = Mild depression, 10-14 = Moderate depression, 15-19 = Moderately severe depression, 20-27 = Severe depression   Psychosocial Evaluation and Intervention:  Psychosocial Evaluation - 10/27/23 1431       Psychosocial Evaluation & Interventions   Interventions Encouraged to exercise with the program and follow exercise prescription;Relaxation education;Stress management education    Comments Her depression started with family seperation and the climate that we live in right now. She can look to her daughter, friend and therapis for support.    Expected Outcomes Short: Start [facility] to help with mood. Long: Maintain a healthy mental state    Continue Psychosocial Services  Follow up required by  staff          Psychosocial Re-Evaluation:  Psychosocial Re-Evaluation     Row Name 12/13/23 1727 01/06/24 1727 02/14/24 1751 03/08/24 1728       Psychosocial Re-Evaluation   Current issues with History of Depression;Current Depression Current Psychotropic Meds;Current Depression;History of Depression History of Depression;Current Psychotropic Meds;Current Depression Current Stress Concerns    Comments Relda states that her sleep, stress, and mental health have been steady with no major changes. She states that she feels like she has the resources and support system to deal with these things in her life. She was advised that if she needed any further resources that staff could provide her with information. Linet states that her depression is mostly there and now is a hard time with whats going on in the world. She does well with exercise and it helps her mood. She went to [place] to her family the last couple. Patient reports no issues with their current mental states, sleep, stress, depression or anxiety. Will follow up with patient in a few weeks for any changes. Willamina states she has alot of stress with work. She wants to finish the program and start exercising on her own. 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 She has three more sessions and is ready to graduate [facility].    Expected Outcomes Short: continue to attend cardiac rehab for mental health benefits of exercise. Long: maintain good mental health routine. Short: Continue to exercise regularly to support mental health and notify staff of any changes. Long: maintain mental health and well being through teaching of rehab or prescribed medications independently. Short: Continue to exercise regularly to support mental health and notify staff of any changes. Long: maintain mental health and well being through teaching of rehab or prescribed medications independently. --  Interventions Encouraged to attend Cardiac  Rehabilitation for the exercise Encouraged to attend Cardiac Rehabilitation for the exercise Encouraged to attend Cardiac Rehabilitation for the exercise Encouraged to attend Cardiac Rehabilitation for the exercise    Continue Psychosocial Services  Follow up required by staff Follow up required by staff No Follow up required No Follow up required       Psychosocial Discharge (Final Psychosocial Re-Evaluation):  Psychosocial Re-Evaluation - 03/08/24 1728       Psychosocial Re-Evaluation   Current issues with Current Stress Concerns    Comments Lakishia states she has alot of stress with work. She wants to finish the program and start exercising on her own. 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 She has three more sessions and is ready to graduate [facility].    Interventions Encouraged to attend Cardiac Rehabilitation for the exercise    Continue Psychosocial Services  No Follow up required          Vocational Rehabilitation: Provide vocational rehab assistance to qualifying candidates.   Vocational Rehab Evaluation & Intervention:   Education: Education Goals: Education classes will be provided on a variety of topics geared toward better understanding of heart health and risk factor modification. Participant will state understanding/return demonstration of topics presented as noted by education test scores.  Learning Barriers/Preferences:  Learning Barriers/Preferences - 10/27/23 1427       Learning Barriers/Preferences   Learning Barriers None    Learning Preferences None          General Cardiac Education Topics:  AED/CPR: - Group verbal and written instruction with the use of models to demonstrate the basic use of the AED with the basic ABC's of resuscitation.   Test and Procedures: - Group verbal and visual presentation and models provide information about basic cardiac anatomy and function. Reviews the testing methods done to diagnose  heart disease and the outcomes of the test results. Describes the treatment choices: Medical Management, Angioplasty, or Coronary Bypass Surgery for treating various heart conditions including Myocardial Infarction, Angina, Valve Disease, and Cardiac Arrhythmias. Written material provided at class time.   Medication Safety: - Group verbal and visual instruction to review commonly prescribed medications for heart and lung disease. Reviews the medication, class of the drug, and side effects. Includes the steps to properly store meds and maintain the prescription regimen. Written material provided at class time.   Intimacy: - Group verbal instruction through game format to discuss how heart and lung disease can affect sexual intimacy. Written material provided at class time.   Know Your Numbers and Heart Failure: - Group verbal and visual instruction to discuss disease risk factors for cardiac and pulmonary disease and treatment options.  Reviews associated critical values for Overweight/Obesity, Hypertension, Cholesterol, and Diabetes.  Discusses basics of heart failure: signs/symptoms and treatments.  Introduces Heart Failure Zone chart for action plan for heart failure. Written material provided at class time.   Infection Prevention: - Provides verbal and written material to individual with discussion of infection control including proper hand washing and proper equipment cleaning during exercise session. Flowsheet Row Cardiac Rehab from 11/02/2023 in [facility] Cardiac [facility] Re[facility]  Date 11/02/23  Educator [facility]  Instruction Review Code 1- Verbalizes Understanding    Falls Prevention: - Provides verbal and written material to individual with discussion of falls prevention and safety. Flowsheet Row Cardiac Rehab from 11/02/2023 in [facility] Cardiac and Pulmonary Rehab  Date 11/02/23  Educator [facility]  Instruction  Review Code 1- Verbalizes Understanding    Other: -Provides group and verbal instruction  on various topics (see comments)   Knowledge Questionnaire Score:   Core Components/Risk Factors/Patient Goals at Admission:  Personal Goals and Risk Factors at Admission - 10/27/23 1426       Core Components/Risk Factors/Patient Goals on Admission    Weight Management Yes;Weight Loss    Intervention Weight Management: Develop a combined nutrition and exercise program designed to reach desired caloric intake, while maintaining appropriate intake of nutrient and fiber, sodium and fats, and appropriate energy expenditure required for the weight goal.;Weight Management: Provide education and appropriate resources to help participant work on and attain dietary goals.;Weight Management/Obesity: Establish reasonable short term and long term weight goals.    Expected Outcomes Short Term: Continue to assess and modify interventions until short term weight is achieved;Weight Loss: Understanding of general recommendations for a balanced deficit meal plan, which promotes 1-2 lb weight loss per week and includes a negative energy balance of [phone] kcal/d;Understanding recommendations for meals to include 15-35% energy as protein, 25-35% energy from fat, 35-60% energy from carbohydrates, less than 200mg  of dietary cholesterol, 20-35 gm of total fiber daily;Understanding of distribution of calorie intake throughout the day with the consumption of 4-5 meals/snacks    Hypertension Yes    Intervention Provide education on lifestyle modifcations including regular physical activity/exercise, weight management, moderate sodium restriction and increased consumption of fresh fruit, vegetables, and low fat dairy, alcohol moderation, and smoking cessation.;Monitor prescription use compliance.    Expected Outcomes Short Term: Continued assessment and intervention until BP is < 140/22mm HG in hypertensive participants. < 130/31mm HG in hypertensive participants with diabetes, heart failure or chronic kidney disease.;Long  Term: Maintenance of blood pressure at goal levels.          Education:Diabetes - Individual verbal and written instruction to review signs/symptoms of diabetes, desired ranges of glucose level fasting, after meals and with exercise. Acknowledge that pre and post exercise glucose checks will be done for 3 sessions at entry of program.   Core Components/Risk Factors/Patient Goals Review:   Goals and Risk Factor Review     Row Name 12/13/23 1725 01/06/24 1732 02/14/24 1753 03/08/24 1731       Core Components/Risk Factors/Patient Goals Review   Personal Goals Review Weight Management/Obesity;Hypertension Other;Weight Management/Obesity Other Weight Management/Obesity    Review Aanyah states that she has not been checking BP at home because her blood pressure cuff had dead batteries. She states that she has recently ordered batteries and plans to start checking BP at home once she gets them. Giabella states that she has gained some weight from when she was sick but feels that a lot of that is muscle as she states she is feeling stronger and feels like she has more musculare strength. More recently her weight has been steady, which she is happy with. Jermani was out for two weeks on vacation and came back without weight gain. Improving Strength and stamina is one of the most important things to her. Informed her that she is doing well and increasing her loads on all her machines. Sincerity would like to come off her Plavix  if she can. She informed staff that she has a follow up in December. Informed her to ask her doctor if she could come off or reduce the dose if she is able to. Jessy is graduating the program in a few sessions. She has improved her walk test and plans to exercise once discharged.  She still wants to lose weight and reach her weight goal. Her blood pressure has been good and has had no cardiac events while in [facility].    Expected Outcomes Short: get BP cuff working at home and start checking BP at  home. Long: control cardiac risk factors. Short: improve strength and stamina. Long: continue to workout independently post [facility]. Short: speak with Dr about Plavix . Long: take medications as prescribed. Short: lose a few pounds and graduate. Long: Maintain exercise Post [facility]       Core Components/Risk Factors/Patient Goals at Discharge (Final Review):   Goals and Risk Factor Review - 03/08/24 1731       Core Components/Risk Factors/Patient Goals Review   Personal Goals Review Weight Management/Obesity    Review Kyomi is graduating the program in a few sessions. She has improved her walk test and plans to exercise once discharged. She still wants to lose weight and reach her weight goal. Her blood pressure has been good and has had no cardiac events while in [facility].    Expected Outcomes Short: lose a few pounds and graduate. Long: Maintain exercise Post [facility]          ITP Comments:  ITP Comments     Row Name 10/27/23 1428 11/02/23 1508 11/08/23 1718 11/24/23 0742 12/22/23 0801   ITP Comments Virtual Visit completed. Patient informed on EP and RD appointment and 6 Minute walk test. Patient also informed of patient health questionnaires on My Chart. Patient Verbalizes understanding. Visit diagnosis can be found in [facility] 10/15/2023. Completed and gym orientation for cardiac rehab. Initial ITP created and sent for review to Dr. Oneil Pinal, Medical Director. First full day of exercise!  Patient was oriented to gym and equipment including functions, settings, policies, and procedures.  Patient's individual exercise prescription and treatment plan were reviewed.  All starting workloads were established based on the results of the 6 minute walk test done at initial orientation visit.  The plan for exercise progression was also introduced and progression will be customized based on patient's performance and goals. 30 Day review completed. Medical Director ITP review done, changes  made as directed, and signed approval by Medical Director. New to program. 30 Day review completed. Medical Director ITP review done; changes made as directed and signed approval by Medical Director.    Row Name 01/19/24 1000 02/16/24 0822 03/15/24 0934       ITP Comments 30 Day review completed. Medical Director ITP review done; changes made as directed and signed approval by Medical Director. 30 Day review completed. Medical Director ITP review done; changes made as directed and signed approval by Medical Director. 30 Day review completed. Medical Director ITP review done; changes made as directed and signed approval by Medical Director.        Comments: 30 Day Review ITP

## 2024-03-16 ENCOUNTER — Encounter

## 2024-03-20 ENCOUNTER — Encounter

## 2024-03-21 ENCOUNTER — Telehealth: Payer: Self-pay

## 2024-03-21 NOTE — Telephone Encounter (Signed)
 LVMM to call back re: not coming to rehab

## 2024-03-22 ENCOUNTER — Encounter

## 2024-03-27 ENCOUNTER — Encounter

## 2024-03-29 ENCOUNTER — Encounter

## 2024-03-30 ENCOUNTER — Encounter

## 2024-04-03 ENCOUNTER — Encounter

## 2024-04-12 ENCOUNTER — Encounter: Payer: Self-pay | Admitting: *Deleted

## 2024-04-12 DIAGNOSIS — I214 Non-ST elevation (NSTEMI) myocardial infarction: Secondary | ICD-10-CM

## 2024-04-12 NOTE — Progress Notes (Signed)
 Cardiac Individual Treatment Plan  Patient Details  Name: Angel Douglas MRN: [id number] Date of Birth: 1961/01/13 Referring Provider:   Flowsheet Row Cardiac Rehab from 11/02/2023 in [facility] Cardiac and Pulmonary Rehab  Referring Provider Dr. Cara Lovelace    Initial Encounter Date:  Flowsheet Row Cardiac Rehab from 11/02/2023 in [facility] Cardiac and Pulmonary Rehab  Date 11/02/23    Visit Diagnosis: NSTEMI (non-ST elevated myocardial infarction) [facility])  Patient's Home Medications on Admission: Current Medications[1]  Past Medical History: Past Medical History:  Diagnosis Date   Allergy    Depression    Hypertension    Preeclampsia     Tobacco Use: Tobacco Use History[2]  Labs: Review Flowsheet       Latest Ref Rng & Units 10/16/2023  Labs for ITP Cardiac and Pulmonary Rehab  Cholestrol 0 - 200 mg/dL 845   LDL (calc) 0 - 99 mg/dL 92   HDL-C >59 mg/dL 54   Trlycerides <849 mg/dL 42      Exercise Target Goals: Exercise Program Goal: Individual exercise prescription set using results from initial 6 min walk test and THRR while considering  patients activity barriers and safety.   Exercise Prescription Goal: Initial exercise prescription builds to 30-45 minutes a day of aerobic activity, 2-3 days per week.  Home exercise guidelines will be given to patient during program as part of exercise prescription that the participant will acknowledge.   Education: Aerobic Exercise: - Group verbal and visual presentation on the components of exercise prescription. Introduces F.I.T.T principle from ACSM for exercise prescriptions.  Reviews F.I.T.T. principles of aerobic exercise including progression. Written material provided at class time.   Education: Resistance Exercise: - Group verbal and visual presentation on the components of exercise prescription. Introduces F.I.T.T principle from ACSM for exercise prescriptions  Reviews F.I.T.T. principles of resistance exercise  including progression. Written material provided at class time.    Education: Exercise & Equipment Safety: - Individual verbal instruction and demonstration of equipment use and safety with use of the equipment. Flowsheet Row Cardiac Rehab from 11/02/2023 in [facility] Cardiac and Pulmonary Rehab  Date 11/02/23  Educator [facility]  Instruction Review Code 1- Verbalizes Understanding    Education: Exercise Physiology & General Exercise Guidelines: - Group verbal and written instruction with models to review the exercise physiology of the cardiovascular system and associated critical values. Provides general exercise guidelines with specific guidelines to those with heart or lung disease. Written material provided at class time.   Education: Flexibility, Balance, Mind/Body Relaxation: - Group verbal and visual presentation with interactive activity on the components of exercise prescription. Introduces F.I.T.T principle from ACSM for exercise prescriptions. Reviews F.I.T.T. principles of flexibility and balance exercise training including progression. Also discusses the mind body connection.  Reviews various relaxation techniques to help reduce and manage stress (i.e. Deep breathing, progressive muscle relaxation, and visualization). Balance handout provided to take home. Written material provided at class time.   Activity Barriers & Risk Stratification:  Activity Barriers & Cardiac Risk Stratification - 11/02/23 1510       Activity Barriers & Cardiac Risk Stratification   Activity Barriers None    Cardiac Risk Stratification Moderate          6 Minute Walk:  6 Minute Walk     Row Name 11/02/23 1509 02/02/24 1734       6 Minute Walk   Phase Initial Discharge    Distance 1440 feet 1605 feet    Distance % Change -- 11 %  Distance Feet Change -- 165 ft    Walk Time 6 minutes 6 minutes    # of Rest Breaks 0 0    MPH 2.73 3.04    METS 3.43 3.8    RPE 7 10    Perceived Dyspnea  0 0    VO2  Peak 12 13.32    Symptoms No No    Resting HR 64 bpm 74 bpm    Resting BP 106/64 128/82    Resting Oxygen Saturation  96 % --    Exercise Oxygen Saturation  during 6 min walk 96 % --    Max Ex. HR 101 bpm 103 bpm    Max Ex. BP 124/66 142/80    2 Minute Post BP 114/60 124/80       Oxygen Initial Assessment:   Oxygen Re-Evaluation:   Oxygen Discharge (Final Oxygen Re-Evaluation):   Initial Exercise Prescription:  Initial Exercise Prescription - 11/02/23 1500       Date of Initial Exercise RX and Referring Provider   Date 11/02/23    Referring Provider Dr. Cara Lovelace      Oxygen   Maintain Oxygen Saturation 88% or higher      Treadmill   MPH 2.7    Grade 0    Minutes 15    METs 3.07      NuStep   Level 2    SPM 80    Minutes 15    METs 3.43      REL-XR   Level 2    Watts 25    Speed 50    Minutes 15    METs 3.43      T5 Nustep   Level 2   T6   SPM 80    Minutes 15    METs 3.43      Prescription Details   Duration Progress to 30 minutes of continuous aerobic without signs/symptoms of physical distress      Intensity   THRR 40-80% of Max Heartrate 101-139    Ratings of Perceived Exertion 11-13    Perceived Dyspnea 0-4      Progression   Progression Continue to progress workloads to maintain intensity without signs/symptoms of physical distress.      Resistance Training   Training Prescription Yes    Weight 7lb    Reps 10-15          Perform Capillary Blood Glucose checks as needed.  Exercise Prescription Changes:   Exercise Prescription Changes     Row Name 11/02/23 1500 11/19/23 1000 12/01/23 1500 12/06/23 1700 12/13/23 1600     Response to Exercise   Blood Pressure (Admit) 106/64 108/72 118/58 -- 120/60   Blood Pressure (Exercise) 124/66 140/58 156/82 -- --   Blood Pressure (Exit) 114/66 96/70 118/64 -- 100/62   Heart Rate (Admit) 64 bpm 72 bpm 70 bpm -- 70 bpm   Heart Rate (Exercise) 101 bpm 122 bpm 131 bpm -- 124 bpm    Heart Rate (Exit) 66 bpm 90 bpm 74 bpm -- 88 bpm   Oxygen Saturation (Admit) 96 % -- -- -- --   Oxygen Saturation (Exercise) 96 % -- -- -- --   Oxygen Saturation (Exit) 96 % -- -- -- --   Rating of Perceived Exertion (Exercise) 7 15 15  -- 15   Perceived Dyspnea (Exercise) 0 0 0 -- 0   Symptoms none none none -- none   Comments results first 2 weeks of exercise -- -- --  Duration -- Progress to 30 minutes of  aerobic without signs/symptoms of physical distress Progress to 30 minutes of  aerobic without signs/symptoms of physical distress Progress to 30 minutes of  aerobic without signs/symptoms of physical distress Continue with 30 min of aerobic exercise without signs/symptoms of physical distress.   Intensity -- THRR unchanged THRR unchanged THRR unchanged THRR unchanged     Progression   Progression -- Continue to progress workloads to maintain intensity without signs/symptoms of physical distress. Continue to progress workloads to maintain intensity without signs/symptoms of physical distress. Continue to progress workloads to maintain intensity without signs/symptoms of physical distress. Continue to progress workloads to maintain intensity without signs/symptoms of physical distress.   Average METs -- 5.2 5.38 5.38 4.94     Resistance Training   Training Prescription -- Yes Yes Yes Yes   Weight -- 7lb 7lb 7lb 3 lb   Reps -- 10-15 10-15 10-15 10-15     Interval Training   Interval Training -- No No No No     Treadmill   MPH -- 3.3 3 3  3.4   Grade -- 5 8 8 7    Minutes -- 15 15 15 15    METs -- 5.8 6.61 6.61 6.88     NuStep   Level -- 2 2 2 4   T6: 4   Minutes -- 15 15 15 15    METs -- 3.1 3.7 3.7 4  T6: 2.5     REL-XR   Level -- 5 10 10 7    Minutes -- 15 15 15 15    METs -- 6.8 7.3 7.3 6     T5 Nustep   Level -- -- 2 2 --   Minutes -- -- 15 15 --   METs -- -- 3 3 --     Rower   Level -- -- 3 3 2    Watts -- -- 41 41 39   Minutes -- -- 15 15 15    METs -- -- 4.37  4.37 5.57     Home Exercise Plan   Plans to continue exercise at -- -- -- [employer] (comment) [employer] (comment)   Frequency -- -- -- Add 2 additional days to program exercise sessions. Add 2 additional days to program exercise sessions.   Initial Home Exercises Provided -- -- -- 12/06/23 12/06/23     Oxygen   Maintain Oxygen Saturation -- 88% or higher 88% or higher 88% or higher 88% or higher    Row Name 12/29/23 1000 01/11/24 1400 01/25/24 0900 02/09/24 1600 02/24/24 1000     Response to Exercise   Blood Pressure (Admit) 106/62 104/60 108/68 128/82 104/62   Blood Pressure (Exit) 102/62 106/60 120/80 110/62 98/60   Heart Rate (Admit) 60 bpm 80 bpm 78 bpm 81 bpm 71 bpm   Heart Rate (Exercise) 135 bpm 137 bpm 1414 bpm 144 bpm 130 bpm   Heart Rate (Exit) 85 bpm 90 bpm 94 bpm 99 bpm 83 bpm   Rating of Perceived Exertion (Exercise) 13 13 15 15 15    Symptoms none none none none none   Duration Continue with 30 min of aerobic exercise without signs/symptoms of physical distress. Continue with 30 min of aerobic exercise without signs/symptoms of physical distress. Continue with 30 min of aerobic exercise without signs/symptoms of physical distress. Continue with 30 min of aerobic exercise without signs/symptoms of physical distress. Continue with 30 min of aerobic exercise without signs/symptoms of physical distress.   Intensity THRR unchanged THRR unchanged  THRR unchanged THRR unchanged THRR unchanged     Progression   Progression Continue to progress workloads to maintain intensity without signs/symptoms of physical distress. Continue to progress workloads to maintain intensity without signs/symptoms of physical distress. Continue to progress workloads to maintain intensity without signs/symptoms of physical distress. Continue to progress workloads to maintain intensity without signs/symptoms of physical distress. Continue to progress workloads to maintain intensity without  signs/symptoms of physical distress.   Average METs 6.02 5.87 5.77 6.27 6.09     Resistance Training   Training Prescription Yes Yes Yes Yes Yes   Weight 3 lb 3 lb 3 lb 3 lb 3 lb   Reps 10-15 10-15 10-15 10-15 10-15     Interval Training   Interval Training No No No No No     Treadmill   MPH 3.1 3.3 3.4 3.1 3   Grade 6 5 7  7.5 6   Minutes 15 15 15 15 15    METs 5.95 5.8 6.88 6.58 5.78     NuStep   Level 5 -- -- -- --   Minutes 25 -- -- -- --   METs 7.4 -- -- -- --     REL-XR   Level 6 -- -- -- --   Minutes 15 -- -- -- --   METs 5.7 -- -- -- --     Rower   Level 10 6 10 10 10    Watts 61 62 60 62 58   Minutes 15 15 15 15 15    METs 6.67 6.59 6.53 6.66 6.47     Home Exercise Plan   Plans to continue exercise at [employer] (comment) [employer] (comment) [employer] (comment) [employer] (comment) [employer] (comment)   Frequency Add 2 additional days to program exercise sessions. Add 2 additional days to program exercise sessions. Add 2 additional days to program exercise sessions. Add 2 additional days to program exercise sessions. Add 2 additional days to program exercise sessions.   Initial Home Exercises Provided 12/06/23 12/06/23 12/06/23 12/06/23 12/06/23     Oxygen   Maintain Oxygen Saturation 88% or higher 88% or higher 88% or higher 88% or higher 88% or higher    Row Name 03/21/24 1400             Response to Exercise   Blood Pressure (Admit) 118/70       Blood Pressure (Exit) 104/72       Heart Rate (Admit) 73 bpm       Heart Rate (Exercise) 140 bpm       Heart Rate (Exit) 92 bpm       Rating of Perceived Exertion (Exercise) 13       Symptoms none       Duration Continue with 30 min of aerobic exercise without signs/symptoms of physical distress.       Intensity THRR unchanged         Progression   Progression Continue to progress workloads to maintain intensity without signs/symptoms of physical distress.        Average METs 5.73         Resistance Training   Training Prescription Yes       Weight 3 lb       Reps 10-15         Interval Training   Interval Training No         Treadmill   MPH 3       Grade 5  Minutes 15       METs 5.36         Rower   Level 10       Watts 57       Minutes 15       METs 6.37         Home Exercise Plan   Plans to continue exercise at [employer] (comment)       Frequency Add 2 additional days to program exercise sessions.       Initial Home Exercises Provided 12/06/23         Oxygen   Maintain Oxygen Saturation 88% or higher          Exercise Comments:   Exercise Comments     Row Name 11/08/23 1719           Exercise Comments First full day of exercise!  Patient was oriented to gym and equipment including functions, settings, policies, and procedures.  Patient's individual exercise prescription and treatment plan were reviewed.  All starting workloads were established based on the results of the 6 minute walk test done at initial orientation visit.  The plan for exercise progression was also introduced and progression will be customized based on patient's performance and goals.          Exercise Goals and Review:   Exercise Goals     Row Name 11/02/23 1513             Exercise Goals   Increase Physical Activity Yes       Intervention Provide advice, education, support and counseling about physical activity/exercise needs.;Develop an individualized exercise prescription for aerobic and resistive training based on initial evaluation findings, risk stratification, comorbidities and participant's personal goals.       Expected Outcomes Short Term: Attend rehab on a regular basis to increase amount of physical activity.;Long Term: Add in home exercise to make exercise part of routine and to increase amount of physical activity.;Long Term: Exercising regularly at least 3-5 days a week.       Increase Strength and Stamina Yes        Intervention Provide advice, education, support and counseling about physical activity/exercise needs.;Develop an individualized exercise prescription for aerobic and resistive training based on initial evaluation findings, risk stratification, comorbidities and participant's personal goals.       Expected Outcomes Short Term: Increase workloads from initial exercise prescription for resistance, speed, and METs.;Short Term: Perform resistance training exercises routinely during rehab and add in resistance training at home;Long Term: Improve cardiorespiratory fitness, muscular endurance and strength as measured by increased METs and functional capacity ( )       Able to understand and use rate of perceived exertion (RPE) scale Yes       Intervention Provide education and explanation on how to use RPE scale       Expected Outcomes Short Term: Able to use RPE daily in rehab to express subjective intensity level;Long Term:  Able to use RPE to guide intensity level when exercising independently       Able to understand and use Dyspnea scale Yes       Intervention Provide education and explanation on how to use Dyspnea scale       Expected Outcomes Long Term: Able to use Dyspnea scale to guide intensity level when exercising independently;Short Term: Able to use Dyspnea scale daily in rehab to express subjective sense of shortness of breath during exertion       Knowledge  and understanding of Target Heart Rate Range (THRR) Yes       Intervention Provide education and explanation of THRR including how the numbers were predicted and where they are located for reference       Expected Outcomes Short Term: Able to state/look up THRR;Short Term: Able to use daily as guideline for intensity in rehab;Long Term: Able to use THRR to govern intensity when exercising independently       Able to check pulse independently Yes       Intervention Provide education and demonstration on how to check pulse in carotid and  radial arteries.;Review the importance of being able to check your own pulse for safety during independent exercise       Expected Outcomes Short Term: Able to explain why pulse checking is important during independent exercise;Long Term: Able to check pulse independently and accurately       Understanding of Exercise Prescription Yes       Intervention Provide education, explanation, and written materials on patient's individual exercise prescription       Expected Outcomes Short Term: Able to explain program exercise prescription;Long Term: Able to explain home exercise prescription to exercise independently          Exercise Goals Re-Evaluation :  Exercise Goals Re-Evaluation     Row Name 11/08/23 1719 11/19/23 1029 12/01/23 1557 12/06/23 1736 12/13/23 1608     Exercise Goal Re-Evaluation   Exercise Goals Review Increase Physical Activity;Knowledge and understanding of Target Heart Rate Range (THRR);Able to understand and use rate of perceived exertion (RPE) scale;Understanding of Exercise Prescription;Increase Strength and Stamina;Able to check pulse independently Increase Physical Activity;Increase Strength and Stamina;Understanding of Exercise Prescription Increase Physical Activity;Increase Strength and Stamina;Understanding of Exercise Prescription Increase Physical Activity;Increase Strength and Stamina;Able to understand and use rate of perceived exertion (RPE) scale;Able to understand and use Dyspnea scale;Knowledge and understanding of Target Heart Rate Range (THRR);Able to check pulse independently;Understanding of Exercise Prescription Increase Physical Activity;Increase Strength and Stamina;Understanding of Exercise Prescription   Comments Reviewed RPE and dyspnea scale, THR and program prescription with pt today.  Pt voiced understanding and was given a copy of goals to take home. Jamyah is off to a great start in rehab. She was able to attend her first 3 sessions during this weeks  review period. During these sessions she was able to increase from 2.7 to 3. on the treadmill, and increase to level 5 on the XR. We will continue to monitor her progress in the program. Maurisa continues to do well in rehab. She was recently able to increase her treadmill incline from 5% to 8%, while maintaining a speed of . She was also able to increase from level 5 to level 10 on the XR. We will continue to monitor her progress in the program. Reviewed home exercise with pt today.  Pt plans to go to her community gym for exercise.  Reviewed THR, pulse, RPE, sign and symptoms, pulse oximetery and when to call 911 or MD.  Also discussed weather considerations and indoor options.  Pt voiced understanding. Harlea continues to do well in rehab. She recently increased her treadmill workload to a speed of 3.4 mph with an incline of 7%. She also improved to level 4 on the T4 nustep. She did decrease from 7 lb to 3 lb handweights for resistance training as well. We will continue to monitor her progress in the program.   Expected Outcomes Short: Use RPE daily to regulate intensity.  Long:  Follow program prescription in THR. Short: Continue to follow exercise prescription. Long: Continue exercise to improve strength and stamina. Short: Continue to increase treadmill workload. Long: Continue exercise to improev strength and stamina. Short: add 1-2 days per week of exercise on off days of cardiac rehab. Long: maintain independent exercise routine upon graduation from cardiac rehab. Short: Increase back up to 7 lb hand weights for resistance training. Long: Continue exercise to increase strength and stamina.    Row Name 12/29/23 1029 01/11/24 1458 01/25/24 0931 01/31/24 1732 02/02/24 1735     Exercise Goal Re-Evaluation   Exercise Goals Review Increase Physical Activity;Increase Strength and Stamina;Understanding of Exercise Prescription Increase Physical Activity;Increase Strength and Stamina;Understanding of  Exercise Prescription Increase Physical Activity;Increase Strength and Stamina;Understanding of Exercise Prescription Increase Physical Activity;Increase Strength and Stamina;Understanding of Exercise Prescription Increase Physical Activity;Increase Strength and Stamina;Understanding of Exercise Prescription   Comments Takerra continues to make improvements in rehab. She has been able to increase from level 4 to 5 on the T4 nustep, as well as increase from level 2 to 10 on the rower. We will continue to monitor her progress in the program. Roselina continues to do well in rehab. She recently increased her speed on the treadmill to 3.3 mph while maintaining an incline of 5%. Her workload on the rower also decreased from level 10 to level 6 since the last review. We will continue to monitor her progress in the program. Galya continues to do well in rehab. She recently increased her speed on the treadmill back up to to 3.4 mph with an incline of 7%. She also improved her level on the rower back up to level 10. We will continue to monitor her progress in the program. Home exercise follow up done today. Marykay reports that she has added one day a week of exercise at home in addtion to her 3 days a week in cardiac rehab. She is using a engineer, drilling and walking. She reports that she is tolerating her independent exercise well with no conerns to report. Laelyn improved on her 6 MWT by 11%! She will graduate from cardiac rehab in the near future and plans to use a community gym to continue with her exercise routine.   Expected Outcomes Short: Continue to increase workload on the rowing machine. Long: Continue exercise to improve strength and stamina. Short: Increase workload on the Rower back up to level 10. Long: Continue exercise to improve strength and stamina. Short: Continue to progressively increase treadmill workload. Long: Continue exercise to improve strength and stamina. Short: continue to consistently exercise one day a  week outside of class, and progress to 2. Long: maintain independent exercise routine upon graduation from cardiac rehab. Short: graduate Long: maintain independent exercise routine.    Row Name 02/09/24 1621 02/24/24 1005 03/08/24 1121 03/21/24 1424 04/05/24 1109     Exercise Goal Re-Evaluation   Exercise Goals Review Increase Physical Activity;Increase Strength and Stamina;Understanding of Exercise Prescription Increase Physical Activity;Increase Strength and Stamina;Understanding of Exercise Prescription Increase Physical Activity;Increase Strength and Stamina;Understanding of Exercise Prescription Increase Physical Activity;Increase Strength and Stamina;Understanding of Exercise Prescription Increase Physical Activity;Increase Strength and Stamina;Understanding of Exercise Prescription   Comments Tynia continues to do well in rehab and she will graduate soon. She improved by 11% on her post with a total of 1605 feet. She also increased her incline on the treadmill to 7.5% while maintaining a speed of 3.1 mph. She increased her average watts on the rower to 62 watts  at level 10. We will continue to monitor her progress in the program until graduation. Leannah continues to do well in rehab and she will graduate soon. She improved by 11% on her post . She continues to walk on the treadmill at a speed of 3 mph with an incline of 6%. We will continue to monitor her progress in the program until graduation. Amaziah has not attended rehab since the last review. We will call her to see when she plans to return to the program. We will continue to monitor her progress until she graduates from the program. Rennee has only attended two sessions since the last review. She continues to work at level 10 on the rower, and walk the treadmill at 3 mph with an incline of 5%. We will continue to monitor her progress until she graduates from the program. Anina has not atttended since 03/09/24. She has been very busy with her  job making it difficult to make rehab sessions. We are in communication with her to determine when she can come back. We will continue monitor her progress when she returns until graduation.   Expected Outcomes Short: Graduate. Long: Continue to exercise independently. Short: Graduate. Long: Continue to exercise independently. Short: Return to rehab when appropriate. Long: Graduate. Short: Graduate. Long: Continue to exercise independently. Short: Return and graduate. Long: Continue to exercise independently.      Discharge Exercise Prescription (Final Exercise Prescription Changes):  Exercise Prescription Changes - 03/21/24 1400       Response to Exercise   Blood Pressure (Admit) 118/70    Blood Pressure (Exit) 104/72    Heart Rate (Admit) 73 bpm    Heart Rate (Exercise) 140 bpm    Heart Rate (Exit) 92 bpm    Rating of Perceived Exertion (Exercise) 13    Symptoms none    Duration Continue with 30 min of aerobic exercise without signs/symptoms of physical distress.    Intensity THRR unchanged      Progression   Progression Continue to progress workloads to maintain intensity without signs/symptoms of physical distress.    Average METs 5.73      Resistance Training   Training Prescription Yes    Weight 3 lb    Reps 10-15      Interval Training   Interval Training No      Treadmill   MPH 3    Grade 5    Minutes 15    METs 5.36      Rower   Level 10    Watts 57    Minutes 15    METs 6.37      Home Exercise Plan   Plans to continue exercise at [employer] (comment)    Frequency Add 2 additional days to program exercise sessions.    Initial Home Exercises Provided 12/06/23      Oxygen   Maintain Oxygen Saturation 88% or higher          Nutrition:  Target Goals: Understanding of nutrition guidelines, daily intake of sodium 1500mg , cholesterol 200mg , calories 30% from fat and 7% or less from saturated fats, daily to have 5 or more servings of fruits and  vegetables.  Education: Nutrition 1 -Group instruction provided by verbal, written material, interactive activities, discussions, models, and posters to present general guidelines for heart healthy nutrition including macronutrients, label reading, and promoting whole foods over processed counterparts. Education serves as pensions consultant of discussion of heart healthy eating for all. Written material provided at class time.  Education: Nutrition 2 -Group instruction provided by verbal, written material, interactive activities, discussions, models, and posters to present general guidelines for heart healthy nutrition including sodium, cholesterol, and saturated fat. Providing guidance of habit forming to improve blood pressure, cholesterol, and body weight. Written material provided at class time.     Biometrics:  Pre Biometrics - 11/02/23 1514       Pre Biometrics   Height 5' (1.524 m)    Weight 137 lb (62.1 kg)    Waist Circumference 30 inches    Hip Circumference 39 inches    Waist to Hip Ratio 0.77 %    BMI (Calculated) 26.76    Single Leg Stand 22 seconds          Post Biometrics - 02/02/24 1736        Post  Biometrics   Height 5' (1.524 m)    Weight 144 lb 6.4 oz (65.5 kg)    Waist Circumference 33.5 inches    Hip Circumference 39 inches    Waist to Hip Ratio 0.86 %    BMI (Calculated) 28.2    Single Leg Stand 26.47 seconds          Nutrition Therapy Plan and Nutrition Goals:  Nutrition Therapy & Goals - 12/13/23 1724       Nutrition Therapy   RD appointment deferred Yes          Nutrition Assessments:  MEDIFICTS Score Key: >=70 Need to make dietary changes  40-70 Heart Healthy Diet <= 40 Therapeutic Level Cholesterol Diet   Picture Your Plate Scores: <59 Unhealthy dietary pattern with much room for improvement. 41-50 Dietary pattern unlikely to meet recommendations for good health and room for improvement. 51-60 More healthful dietary pattern,  with some room for improvement.  >60 Healthy dietary pattern, although there may be some specific behaviors that could be improved.    Nutrition Goals Re-Evaluation:  Nutrition Goals Re-Evaluation     Row Name 12/13/23 1724 01/06/24 1731 02/14/24 1750 03/08/24 1730       Goals   Comment Continues to defer RD apt. Patient deferred RD appointment. Patient deferred RD appointment. Patient deferred RD appointment.       Nutrition Goals Discharge (Final Nutrition Goals Re-Evaluation):  Nutrition Goals Re-Evaluation - 03/08/24 1730       Goals   Comment Patient deferred RD appointment.          Psychosocial: Target Goals: Acknowledge presence or absence of significant depression and/or stress, maximize coping skills, provide positive support system. Participant is able to verbalize types and ability to use techniques and skills needed for reducing stress and depression.   Education: Stress, Anxiety, and Depression - Group verbal and visual presentation to define topics covered.  Reviews how body is impacted by stress, anxiety, and depression.  Also discusses healthy ways to reduce stress and to treat/manage anxiety and depression. Written material provided at class time.   Education: Sleep Hygiene -Provides group verbal and written instruction about how sleep can affect your health.  Define sleep hygiene, discuss sleep cycles and impact of sleep habits. Review good sleep hygiene tips.   Initial Review & Psychosocial Screening:  Initial Psych Review & Screening - 10/27/23 1430       Initial Review   Current issues with Current Depression;History of Depression      Family Dynamics   Good Support System? Yes    Comments Her depression started with family seperation and the climate that we live in right  now. She can look to her daughter, friend and therapis for support.      Barriers   Psychosocial barriers to participate in program The patient should benefit from training in  stress management and relaxation.;There are no identifiable barriers or psychosocial needs.      Screening Interventions   Interventions Provide feedback about the scores to participant;To provide support and resources with identified psychosocial needs;Encouraged to exercise    Expected Outcomes Short Term goal: Utilizing psychosocial counselor, staff and physician to assist with identification of specific Stressors or current issues interfering with healing process. Setting desired goal for each stressor or current issue identified.;Long Term Goal: Stressors or current issues are controlled or eliminated.;Short Term goal: Identification and review with participant of any Quality of Life or Depression concerns found by scoring the questionnaire.;Long Term goal: The participant improves quality of Life and PHQ9 Scores as seen by post scores and/or verbalization of changes          Quality of Life Scores:   Scores of 19 and below usually indicate a poorer quality of life in these areas.  A difference of  2-3 points is a clinically meaningful difference.  A difference of 2-3 points in the total score of the Quality of Life Index has been associated with significant improvement in overall quality of life, self-image, physical symptoms, and general health in studies assessing change in quality of life.  PHQ-9: Review Flowsheet       11/02/2023  Depression screen PHQ 2/9  Decreased Interest 0  Down, Depressed, Hopeless 0  PHQ - 2 Score 0  Altered sleeping 0  Tired, decreased energy 1  Change in appetite 0  Feeling bad or failure about yourself  0  Trouble concentrating 0  Moving slowly or fidgety/restless 0  Suicidal thoughts 0  PHQ-9 Score 1   Difficult doing work/chores Not difficult at all    Details       Data saved with a previous flowsheet row definition        Interpretation of Total Score  Total Score Depression Severity:  1-4 = Minimal depression, 5-9 = Mild depression,  10-14 = Moderate depression, 15-19 = Moderately severe depression, 20-27 = Severe depression   Psychosocial Evaluation and Intervention:  Psychosocial Evaluation - 10/27/23 1431       Psychosocial Evaluation & Interventions   Interventions Encouraged to exercise with the program and follow exercise prescription;Relaxation education;Stress management education    Comments Her depression started with family seperation and the climate that we live in right now. She can look to her daughter, friend and therapis for support.    Expected Outcomes Short: Start [facility] to help with mood. Long: Maintain a healthy mental state    Continue Psychosocial Services  Follow up required by staff          Psychosocial Re-Evaluation:  Psychosocial Re-Evaluation     Row Name 12/13/23 1727 01/06/24 1727 02/14/24 1751 03/08/24 1728       Psychosocial Re-Evaluation   Current issues with History of Depression;Current Depression Current Psychotropic Meds;Current Depression;History of Depression History of Depression;Current Psychotropic Meds;Current Depression Current Stress Concerns    Comments Teandra states that her sleep, stress, and mental health have been steady with no major changes. She states that she feels like she has the resources and support system to deal with these things in her life. She was advised that if she needed any further resources that staff could provide her with information. Elivia states that  her depression is mostly there and now is a hard time with whats going on in the world. She does well with exercise and it helps her mood. She went to [place] to her family the last couple. Patient reports no issues with their current mental states, sleep, stress, depression or anxiety. Will follow up with patient in a few weeks for any changes. Anber states she has alot of stress with work. She wants to finish the program and start exercising on her own. 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 She has three more sessions and is ready to graduate [facility].    Expected Outcomes Short: continue to attend cardiac rehab for mental health benefits of exercise. Long: maintain good mental health routine. Short: Continue to exercise regularly to support mental health and notify staff of any changes. Long: maintain mental health and well being through teaching of rehab or prescribed medications independently. Short: Continue to exercise regularly to support mental health and notify staff of any changes. Long: maintain mental health and well being through teaching of rehab or prescribed medications independently. --    Interventions Encouraged to attend Cardiac Rehabilitation for the exercise Encouraged to attend Cardiac Rehabilitation for the exercise Encouraged to attend Cardiac Rehabilitation for the exercise Encouraged to attend Cardiac Rehabilitation for the exercise    Continue Psychosocial Services  Follow up required by staff Follow up required by staff No Follow up required No Follow up required       Psychosocial Discharge (Final Psychosocial Re-Evaluation):  Psychosocial Re-Evaluation - 03/08/24 1728       Psychosocial Re-Evaluation   Current issues with Current Stress Concerns    Comments Genise states she has alot of stress with work. She wants to finish the program and start exercising on her own. 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 She has three more sessions and is ready to graduate [facility].    Interventions Encouraged to attend Cardiac Rehabilitation for the exercise    Continue Psychosocial Services  No Follow up required          Vocational Rehabilitation: Provide vocational rehab assistance to qualifying candidates.   Vocational Rehab Evaluation & Intervention:   Education: Education Goals: Education classes will be provided on a variety of topics geared toward better understanding of heart health and risk factor  modification. Participant will state understanding/return demonstration of topics presented as noted by education test scores.  Learning Barriers/Preferences:  Learning Barriers/Preferences - 10/27/23 1427       Learning Barriers/Preferences   Learning Barriers None    Learning Preferences None          General Cardiac Education Topics:  AED/CPR: - Group verbal and written instruction with the use of models to demonstrate the basic use of the AED with the basic ABC's of resuscitation.   Test and Procedures: - Group verbal and visual presentation and models provide information about basic cardiac anatomy and function. Reviews the testing methods done to diagnose heart disease and the outcomes of the test results. Describes the treatment choices: Medical Management, Angioplasty, or Coronary Bypass Surgery for treating various heart conditions including Myocardial Infarction, Angina, Valve Disease, and Cardiac Arrhythmias. Written material provided at class time.   Medication Safety: - Group verbal and visual instruction to review commonly prescribed medications for heart and lung disease. Reviews the medication, class of the drug, and side effects. Includes the steps to properly store  meds and maintain the prescription regimen. Written material provided at class time.   Intimacy: - Group verbal instruction through game format to discuss how heart and lung disease can affect sexual intimacy. Written material provided at class time.   Know Your Numbers and Heart Failure: - Group verbal and visual instruction to discuss disease risk factors for cardiac and pulmonary disease and treatment options.  Reviews associated critical values for Overweight/Obesity, Hypertension, Cholesterol, and Diabetes.  Discusses basics of heart failure: signs/symptoms and treatments.  Introduces Heart Failure Zone chart for action plan for heart failure. Written material provided at class time.   Infection  Prevention: - Provides verbal and written material to individual with discussion of infection control including proper hand washing and proper equipment cleaning during exercise session. Flowsheet Row Cardiac Rehab from 11/02/2023 in [facility] Cardiac and Pulmonary Rehab  Date 11/02/23  Educator [facility]  Instruction Review Code 1- Verbalizes Understanding    Falls Prevention: - Provides verbal and written material to individual with discussion of falls prevention and safety. Flowsheet Row Cardiac Rehab from 11/02/2023 in [facility] Cardiac and Pulmonary Rehab  Date 11/02/23  Educator [facility]  Instruction Review Code 1- Verbalizes Understanding    Other: -Provides group and verbal instruction on various topics (see comments)   Knowledge Questionnaire Score:   Core Components/Risk Factors/Patient Goals at Admission:  Personal Goals and Risk Factors at Admission - 10/27/23 1426       Core Components/Risk Factors/Patient Goals on Admission    Weight Management Yes;Weight Loss    Intervention Weight Management: Develop a combined nutrition and exercise program designed to reach desired caloric intake, while maintaining appropriate intake of nutrient and fiber, sodium and fats, and appropriate energy expenditure required for the weight goal.;Weight Management: Provide education and appropriate resources to help participant work on and attain dietary goals.;Weight Management/Obesity: Establish reasonable short term and long term weight goals.    Expected Outcomes Short Term: Continue to assess and modify interventions until short term weight is achieved;Weight Loss: Understanding of general recommendations for a balanced deficit meal plan, which promotes 1-2 lb weight loss per week and includes a negative energy balance of [phone] kcal/d;Understanding recommendations for meals to include 15-35% energy as protein, 25-35% energy from fat, 35-60% energy from carbohydrates, less than 200mg  of dietary cholesterol, 20-35  gm of total fiber daily;Understanding of distribution of calorie intake throughout the day with the consumption of 4-5 meals/snacks    Hypertension Yes    Intervention Provide education on lifestyle modifcations including regular physical activity/exercise, weight management, moderate sodium restriction and increased consumption of fresh fruit, vegetables, and low fat dairy, alcohol moderation, and smoking cessation.;Monitor prescription use compliance.    Expected Outcomes Short Term: Continued assessment and intervention until BP is < 140/25mm HG in hypertensive participants. < 130/36mm HG in hypertensive participants with diabetes, heart failure or chronic kidney disease.;Long Term: Maintenance of blood pressure at goal levels.          Education:Diabetes - Individual verbal and written instruction to review signs/symptoms of diabetes, desired ranges of glucose level fasting, after meals and with exercise. Acknowledge that pre and post exercise glucose checks will be done for 3 sessions at entry of program.   Core Components/Risk Factors/Patient Goals Review:   Goals and Risk Factor Review     Row Name 12/13/23 1725 01/06/24 1732 02/14/24 1753 03/08/24 1731       Core Components/Risk Factors/Patient Goals Review   Personal Goals Review Weight Management/Obesity;Hypertension Other;Weight Management/Obesity Other Weight Management/Obesity  Review Deborh states that she has not been checking BP at home because her blood pressure cuff had dead batteries. She states that she has recently ordered batteries and plans to start checking BP at home once she gets them. Keyani states that she has gained some weight from when she was sick but feels that a lot of that is muscle as she states she is feeling stronger and feels like she has more musculare strength. More recently her weight has been steady, which she is happy with. Sherlyne was out for two weeks on vacation and came back without weight gain.  Improving Strength and stamina is one of the most important things to her. Informed her that she is doing well and increasing her loads on all her machines. Josalin would like to come off her Plavix  if she can. She informed staff that she has a follow up in December. Informed her to ask her doctor if she could come off or reduce the dose if she is able to. Keryn is graduating the program in a few sessions. She has improved her walk test and plans to exercise once discharged. She still wants to lose weight and reach her weight goal. Her blood pressure has been good and has had no cardiac events while in [facility].    Expected Outcomes Short: get BP cuff working at home and start checking BP at home. Long: control cardiac risk factors. Short: improve strength and stamina. Long: continue to workout independently post [facility]. Short: speak with Dr about Plavix . Long: take medications as prescribed. Short: lose a few pounds and graduate. Long: Maintain exercise Post [facility]       Core Components/Risk Factors/Patient Goals at Discharge (Final Review):   Goals and Risk Factor Review - 03/08/24 1731       Core Components/Risk Factors/Patient Goals Review   Personal Goals Review Weight Management/Obesity    Review Vincenta is graduating the program in a few sessions. She has improved her walk test and plans to exercise once discharged. She still wants to lose weight and reach her weight goal. Her blood pressure has been good and has had no cardiac events while in [facility].    Expected Outcomes Short: lose a few pounds and graduate. Long: Maintain exercise Post [facility]          ITP Comments:  ITP Comments     Row Name 10/27/23 1428 11/02/23 1508 11/08/23 1718 11/24/23 0742 12/22/23 0801   ITP Comments Virtual Visit completed. Patient informed on EP and RD appointment and 6 Minute walk test. Patient also informed of patient health questionnaires on My Chart. Patient Verbalizes understanding. Visit  diagnosis can be found in [facility] 10/15/2023. Completed and gym orientation for cardiac rehab. Initial ITP created and sent for review to Dr. Oneil Pinal, Medical Director. First full day of exercise!  Patient was oriented to gym and equipment including functions, settings, policies, and procedures.  Patient's individual exercise prescription and treatment plan were reviewed.  All starting workloads were established based on the results of the 6 minute walk test done at initial orientation visit.  The plan for exercise progression was also introduced and progression will be customized based on patient's performance and goals. 30 Day review completed. Medical Director ITP review done, changes made as directed, and signed approval by Medical Director. New to program. 30 Day review completed. Medical Director ITP review done; changes made as directed and signed approval by Medical Director.    Row Name 01/19/24 1000 02/16/24  [id number] 03/15/24 0934 04/12/24 0939     ITP Comments 30 Day review completed. Medical Director ITP review done; changes made as directed and signed approval by Medical Director. 30 Day review completed. Medical Director ITP review done; changes made as directed and signed approval by Medical Director. 30 Day review completed. Medical Director ITP review done; changes made as directed and signed approval by Medical Director. 30 Day review completed. Medical Director ITP review done; changes made as directed and signed approval by Medical Director.       Comments: 30 Day Review     [1]  Current Outpatient Medications:    acetaminophen  (TYLENOL ) 325 MG tablet, Take 2 tablets (650 mg total) by mouth every 6 (six) hours as needed for mild pain (or Fever >/= 101)., Disp: 20 tablet, Rfl: 0   ALLEGRA-D ALLERGY & CONGESTION 180-240 MG 24 hr tablet, Take 1 tablet by mouth daily., Disp: , Rfl:    aspirin  EC 81 MG tablet, Take 1 tablet (81 mg total) by mouth daily. Swallow whole., Disp: 30 tablet,  Rfl: 11   buPROPion (WELLBUTRIN XL) 300 MG 24 hr tablet, Take 300 mg by mouth daily., Disp: , Rfl:    estradiol (ESTRACE) 0.1 MG/GM vaginal cream, Place vaginally., Disp: , Rfl:    isosorbide  mononitrate (IMDUR ) 30 MG 24 hr tablet, Take 1 tablet (30 mg total) by mouth daily. (Patient not taking: Reported on 10/27/2023), Disp: 30 tablet, Rfl: 11   metoprolol  succinate (TOPROL -XL) 25 MG 24 hr tablet, Take 0.5 tablets (12.5 mg total) by mouth daily. (Patient not taking: Reported on 10/27/2023), Disp: 15 tablet, Rfl: 11   MOUNJARO 15 MG/0.5ML Pen, , Disp: , Rfl:    Multiple Vitamin (MULTI-VITAMINS) TABS, Take by mouth., Disp: , Rfl:    rosuvastatin  (CRESTOR ) 5 MG tablet, Take 1 tablet (5 mg total) by mouth daily., Disp: 30 tablet, Rfl: 11 [2]  Social History Tobacco Use  Smoking Status Former   Types: Cigarettes   Start date: 10/27/1998   Quit date: 10/27/1983   Years since quitting: 40.4  Smokeless Tobacco Never  Tobacco Comments   Quit 25ya, 15 pack year

## 2024-04-24 ENCOUNTER — Telehealth: Payer: Self-pay

## 2024-04-24 NOTE — Telephone Encounter (Signed)
 Attempted outreach related to cardiac rehab attendance. No answer, requested callback and provided callback telephone number.

## 2024-05-10 DIAGNOSIS — I214 Non-ST elevation (NSTEMI) myocardial infarction: Secondary | ICD-10-CM

## 2024-05-10 NOTE — Progress Notes (Signed)
 Cardiac Individual Treatment Plan  Patient Details  Name: Angel Douglas MRN: [id number] Date of Birth: 03-Jul-1960 Referring Provider:   Flowsheet Row Cardiac Rehab from 11/02/2023 in [facility] Cardiac and Pulmonary Rehab  Referring Provider Dr. Cara Lovelace    Initial Encounter Date:  Flowsheet Row Cardiac Rehab from 11/02/2023 in [facility] Cardiac and Pulmonary Rehab  Date 11/02/23    Visit Diagnosis: NSTEMI (non-ST elevated myocardial infarction) [facility])  Patient's Home Medications on Admission: Current Medications[1]  Past Medical History: Past Medical History:  Diagnosis Date   Allergy    Depression    Hypertension    Preeclampsia     Tobacco Use: Tobacco Use History[2]  Labs: Review Flowsheet       Latest Ref Rng & Units 10/16/2023  Labs for ITP Cardiac and Pulmonary Rehab  Cholestrol 0 - 200 mg/dL 845   LDL (calc) 0 - 99 mg/dL 92   HDL-C >59 mg/dL 54   Trlycerides <849 mg/dL 42      Exercise Target Goals: Exercise Program Goal: Individual exercise prescription set using results from initial 6 min walk test and THRR while considering  patients activity barriers and safety.   Exercise Prescription Goal: Initial exercise prescription builds to 30-45 minutes a day of aerobic activity, 2-3 days per week.  Home exercise guidelines will be given to patient during program as part of exercise prescription that the participant will acknowledge.   Education: Aerobic Exercise: - Group verbal and visual presentation on the components of exercise prescription. Introduces F.I.T.T principle from ACSM for exercise prescriptions.  Reviews F.I.T.T. principles of aerobic exercise including progression. Written material provided at class time.   Education: Resistance Exercise: - Group verbal and visual presentation on the components of exercise prescription. Introduces F.I.T.T principle from ACSM for exercise prescriptions  Reviews F.I.T.T. principles of resistance exercise  including progression. Written material provided at class time.    Education: Exercise & Equipment Safety: - Individual verbal instruction and demonstration of equipment use and safety with use of the equipment. Flowsheet Row Cardiac Rehab from 11/02/2023 in [facility] Cardiac and Pulmonary Rehab  Date 11/02/23  Educator [facility]  Instruction Review Code 1- Verbalizes Understanding    Education: Exercise Physiology & General Exercise Guidelines: - Group verbal and written instruction with models to review the exercise physiology of the cardiovascular system and associated critical values. Provides general exercise guidelines with specific guidelines to those with heart or lung disease. Written material provided at class time.   Education: Flexibility, Balance, Mind/Body Relaxation: - Group verbal and visual presentation with interactive activity on the components of exercise prescription. Introduces F.I.T.T principle from ACSM for exercise prescriptions. Reviews F.I.T.T. principles of flexibility and balance exercise training including progression. Also discusses the mind body connection.  Reviews various relaxation techniques to help reduce and manage stress (i.e. Deep breathing, progressive muscle relaxation, and visualization). Balance handout provided to take home. Written material provided at class time.   Activity Barriers & Risk Stratification:   6 Minute Walk:  6 Minute Walk     Row Name 02/02/24 1734         6 Minute Walk   Phase Discharge     Distance 1605 feet     Distance % Change 11 %     Distance Feet Change 165 ft     Walk Time 6 minutes     # of Rest Breaks 0     MPH 3.04     METS 3.8     RPE 10  Perceived Dyspnea  0     VO2 Peak 13.32     Symptoms No     Resting HR 74 bpm     Resting BP 128/82     Max Ex. HR 103 bpm     Max Ex. BP 142/80     2 Minute Post BP 124/80        Oxygen Initial Assessment:   Oxygen Re-Evaluation:   Oxygen Discharge (Final Oxygen  Re-Evaluation):   Initial Exercise Prescription:   Perform Capillary Blood Glucose checks as needed.  Exercise Prescription Changes:   Exercise Prescription Changes     Row Name 11/19/23 1000 12/01/23 1500 12/06/23 1700 12/13/23 1600 12/29/23 1000     Response to Exercise   Blood Pressure (Admit) 108/72 118/58 -- 120/60 106/62   Blood Pressure (Exercise) 140/58 156/82 -- -- --   Blood Pressure (Exit) 96/70 118/64 -- 100/62 102/62   Heart Rate (Admit) 72 bpm 70 bpm -- 70 bpm 60 bpm   Heart Rate (Exercise) 122 bpm 131 bpm -- 124 bpm 135 bpm   Heart Rate (Exit) 90 bpm 74 bpm -- 88 bpm 85 bpm   Rating of Perceived Exertion (Exercise) 15 15 -- 15 13   Perceived Dyspnea (Exercise) 0 0 -- 0 --   Symptoms none none -- none none   Comments first 2 weeks of exercise -- -- -- --   Duration Progress to 30 minutes of  aerobic without signs/symptoms of physical distress Progress to 30 minutes of  aerobic without signs/symptoms of physical distress Progress to 30 minutes of  aerobic without signs/symptoms of physical distress Continue with 30 min of aerobic exercise without signs/symptoms of physical distress. Continue with 30 min of aerobic exercise without signs/symptoms of physical distress.   Intensity THRR unchanged THRR unchanged THRR unchanged THRR unchanged THRR unchanged     Progression   Progression Continue to progress workloads to maintain intensity without signs/symptoms of physical distress. Continue to progress workloads to maintain intensity without signs/symptoms of physical distress. Continue to progress workloads to maintain intensity without signs/symptoms of physical distress. Continue to progress workloads to maintain intensity without signs/symptoms of physical distress. Continue to progress workloads to maintain intensity without signs/symptoms of physical distress.   Average METs 5.2 5.38 5.38 4.94 6.02     Resistance Training   Training Prescription Yes Yes Yes Yes Yes    Weight 7lb 7lb 7lb 3 lb 3 lb   Reps 10-15 10-15 10-15 10-15 10-15     Interval Training   Interval Training No No No No No     Treadmill   MPH 3.3 3 3  3.4 3.1   Grade 5 8 8 7 6    Minutes 15 15 15 15 15    METs 5.8 6.61 6.61 6.88 5.95     NuStep   Level 2 2 2 4   T6: 4 5   Minutes 15 15 15 15 25    METs 3.1 3.7 3.7 4  T6: 2.5 7.4     REL-XR   Level 5 10 10 7 6    Minutes 15 15 15 15 15    METs 6.8 7.3 7.3 6 5.7     T5 Nustep   Level -- 2 2 -- --   Minutes -- 15 15 -- --   METs -- 3 3 -- --     Rower   Level -- 3 3 2 10    Watts -- 41 41 39 61   Minutes -- 15 15  15 15   METs -- 4.37 4.37 5.57 6.67     Home Exercise Plan   Plans to continue exercise at -- -- [employer] (comment) [employer] (comment) [employer] (comment)   Frequency -- -- Add 2 additional days to program exercise sessions. Add 2 additional days to program exercise sessions. Add 2 additional days to program exercise sessions.   Initial Home Exercises Provided -- -- 12/06/23 12/06/23 12/06/23     Oxygen   Maintain Oxygen Saturation 88% or higher 88% or higher 88% or higher 88% or higher 88% or higher    Row Name 01/11/24 1400 01/25/24 0900 02/09/24 1600 02/24/24 1000 03/21/24 1400     Response to Exercise   Blood Pressure (Admit) 104/60 108/68 128/82 104/62 118/70   Blood Pressure (Exit) 106/60 120/80 110/62 98/60 104/72   Heart Rate (Admit) 80 bpm 78 bpm 81 bpm 71 bpm 73 bpm   Heart Rate (Exercise) 137 bpm 1414 bpm 144 bpm 130 bpm 140 bpm   Heart Rate (Exit) 90 bpm 94 bpm 99 bpm 83 bpm 92 bpm   Rating of Perceived Exertion (Exercise) 13 15 15 15 13    Symptoms none none none none none   Duration Continue with 30 min of aerobic exercise without signs/symptoms of physical distress. Continue with 30 min of aerobic exercise without signs/symptoms of physical distress. Continue with 30 min of aerobic exercise without signs/symptoms of physical distress. Continue with 30 min of aerobic  exercise without signs/symptoms of physical distress. Continue with 30 min of aerobic exercise without signs/symptoms of physical distress.   Intensity THRR unchanged THRR unchanged THRR unchanged THRR unchanged THRR unchanged     Progression   Progression Continue to progress workloads to maintain intensity without signs/symptoms of physical distress. Continue to progress workloads to maintain intensity without signs/symptoms of physical distress. Continue to progress workloads to maintain intensity without signs/symptoms of physical distress. Continue to progress workloads to maintain intensity without signs/symptoms of physical distress. Continue to progress workloads to maintain intensity without signs/symptoms of physical distress.   Average METs 5.87 5.77 6.27 6.09 5.73     Resistance Training   Training Prescription Yes Yes Yes Yes Yes   Weight 3 lb 3 lb 3 lb 3 lb 3 lb   Reps 10-15 10-15 10-15 10-15 10-15     Interval Training   Interval Training No No No No No     Treadmill   MPH 3.3 3.4 3.1 3 3    Grade 5 7 7.5 6 5    Minutes 15 15 15 15 15    METs 5.8 6.88 6.58 5.78 5.36     Rower   Level 6 10 10 10 10    Watts 62 60 62 58 57   Minutes 15 15 15 15 15    METs 6.59 6.53 6.66 6.47 6.37     Home Exercise Plan   Plans to continue exercise at [employer] (comment) [employer] (comment) [employer] (comment) [employer] (comment) [employer] (comment)   Frequency Add 2 additional days to program exercise sessions. Add 2 additional days to program exercise sessions. Add 2 additional days to program exercise sessions. Add 2 additional days to program exercise sessions. Add 2 additional days to program exercise sessions.   Initial Home Exercises Provided 12/06/23 12/06/23 12/06/23 12/06/23 12/06/23     Oxygen   Maintain Oxygen Saturation 88% or higher 88% or higher 88% or higher 88% or higher 88% or higher      Exercise  Comments:   Exercise Goals  and Review:   Exercise Goals Re-Evaluation :  Exercise Goals Re-Evaluation     Row Name 11/19/23 1029 12/01/23 1557 12/06/23 1736 12/13/23 1608 12/29/23 1029     Exercise Goal Re-Evaluation   Exercise Goals Review Increase Physical Activity;Increase Strength and Stamina;Understanding of Exercise Prescription Increase Physical Activity;Increase Strength and Stamina;Understanding of Exercise Prescription Increase Physical Activity;Increase Strength and Stamina;Able to understand and use rate of perceived exertion (RPE) scale;Able to understand and use Dyspnea scale;Knowledge and understanding of Target Heart Rate Range (THRR);Able to check pulse independently;Understanding of Exercise Prescription Increase Physical Activity;Increase Strength and Stamina;Understanding of Exercise Prescription Increase Physical Activity;Increase Strength and Stamina;Understanding of Exercise Prescription   Comments Angel Douglas is off to a great start in rehab. She was able to attend her first 3 sessions during this weeks review period. During these sessions she was able to increase from 2.7 to 3. on the treadmill, and increase to level 5 on the XR. We will continue to monitor her progress in the program. Angel Douglas continues to do well in rehab. She was recently able to increase her treadmill incline from 5% to 8%, while maintaining a speed of . She was also able to increase from level 5 to level 10 on the XR. We will continue to monitor her progress in the program. Reviewed home exercise with pt today.  Pt plans to go to her community gym for exercise.  Reviewed THR, pulse, RPE, sign and symptoms, pulse oximetery and when to call 911 or MD.  Also discussed weather considerations and indoor options.  Pt voiced understanding. Angel Douglas continues to do well in rehab. She recently increased her treadmill workload to a speed of 3.4 mph with an incline of 7%. She also improved to level 4 on the T4 nustep. She did decrease from 7 lb to 3 lb  handweights for resistance training as well. We will continue to monitor her progress in the program. Angel Douglas continues to make improvements in rehab. She has been able to increase from level 4 to 5 on the T4 nustep, as well as increase from level 2 to 10 on the rower. We will continue to monitor her progress in the program.   Expected Outcomes Short: Continue to follow exercise prescription. Long: Continue exercise to improve strength and stamina. Short: Continue to increase treadmill workload. Long: Continue exercise to improev strength and stamina. Short: add 1-2 days per week of exercise on off days of cardiac rehab. Long: maintain independent exercise routine upon graduation from cardiac rehab. Short: Increase back up to 7 lb hand weights for resistance training. Long: Continue exercise to increase strength and stamina. Short: Continue to increase workload on the rowing machine. Long: Continue exercise to improve strength and stamina.    Row Name 01/11/24 1458 01/25/24 0931 01/31/24 1732 02/02/24 1735 02/09/24 1621     Exercise Goal Re-Evaluation   Exercise Goals Review Increase Physical Activity;Increase Strength and Stamina;Understanding of Exercise Prescription Increase Physical Activity;Increase Strength and Stamina;Understanding of Exercise Prescription Increase Physical Activity;Increase Strength and Stamina;Understanding of Exercise Prescription Increase Physical Activity;Increase Strength and Stamina;Understanding of Exercise Prescription Increase Physical Activity;Increase Strength and Stamina;Understanding of Exercise Prescription   Comments Angel Douglas continues to do well in rehab. She recently increased her speed on the treadmill to 3.3 mph while maintaining an incline of 5%. Her workload on the rower also decreased from level 10 to level 6 since the last review. We will continue to monitor her progress in the program. Angel Douglas continues  to do well in rehab. She recently increased her speed on the  treadmill back up to to 3.4 mph with an incline of 7%. She also improved her level on the rower back up to level 10. We will continue to monitor her progress in the program. Home exercise follow up done today. Angel Douglas reports that she has added one day a week of exercise at home in addtion to her 3 days a week in cardiac rehab. She is using a engineer, drilling and walking. She reports that she is tolerating her independent exercise well with no conerns to report. Angel Douglas improved on her 6 MWT by 11%! She will graduate from cardiac rehab in the near future and plans to use a community gym to continue with her exercise routine. Angel Douglas continues to do well in rehab and she will graduate soon. She improved by 11% on her post with a total of 1605 feet. She also increased her incline on the treadmill to 7.5% while maintaining a speed of 3.1 mph. She increased her average watts on the rower to 62 watts at level 10. We will continue to monitor her progress in the program until graduation.   Expected Outcomes Short: Increase workload on the Rower back up to level 10. Long: Continue exercise to improve strength and stamina. Short: Continue to progressively increase treadmill workload. Long: Continue exercise to improve strength and stamina. Short: continue to consistently exercise one day a week outside of class, and progress to 2. Long: maintain independent exercise routine upon graduation from cardiac rehab. Short: graduate Long: maintain independent exercise routine. Short: Graduate. Long: Continue to exercise independently.    Row Name 02/24/24 1005 03/08/24 1121 03/21/24 1424 04/05/24 1109 04/17/24 0955     Exercise Goal Re-Evaluation   Exercise Goals Review Increase Physical Activity;Increase Strength and Stamina;Understanding of Exercise Prescription Increase Physical Activity;Increase Strength and Stamina;Understanding of Exercise Prescription Increase Physical Activity;Increase Strength and Stamina;Understanding of  Exercise Prescription Increase Physical Activity;Increase Strength and Stamina;Understanding of Exercise Prescription --   Comments Angel Douglas continues to do well in rehab and she will graduate soon. She improved by 11% on her post . She continues to walk on the treadmill at a speed of 3 mph with an incline of 6%. We will continue to monitor her progress in the program until graduation. Angel Douglas has not attended rehab since the last review. We will call her to see when she plans to return to the program. We will continue to monitor her progress until she graduates from the program. Angel Douglas has only attended two sessions since the last review. She continues to work at level 10 on the rower, and walk the treadmill at 3 mph with an incline of 5%. We will continue to monitor her progress until she graduates from the program. Angel Douglas has not atttended since 03/09/24. She has been very busy with her job making it difficult to make rehab sessions. We are in communication with her to determine when she can come back. We will continue monitor her progress when she returns until graduation. Angel Douglas has not atttended since 03/09/24. She is still very busy with her job making it difficult to make rehab sessions. We are still in communication with her to determine when she can come back. We will continue monitor her progress when she returns until graduation.   Expected Outcomes Short: Graduate. Long: Continue to exercise independently. Short: Return to rehab when appropriate. Long: Graduate. Short: Graduate. Long: Continue to exercise independently. Short: Return and graduate.  Long: Continue to exercise independently. Short: Return and graduate. Long: Continue to exercise independently.    Row Name 05/03/24 1552             Exercise Goal Re-Evaluation   Exercise Goals Review Increase Physical Activity;Increase Strength and Stamina;Understanding of Exercise Prescription       Comments Angel Douglas has not atttended since 03/09/24. She is  still very busy with her job making it difficult to make rehab sessions. We are still in communication with her to determine when she can come back. We will continue monitor her progress when she returns until graduation.       Expected Outcomes Short: Return and graduate. Long: Continue to exercise independently.          Discharge Exercise Prescription (Final Exercise Prescription Changes):  Exercise Prescription Changes - 03/21/24 1400       Response to Exercise   Blood Pressure (Admit) 118/70    Blood Pressure (Exit) 104/72    Heart Rate (Admit) 73 bpm    Heart Rate (Exercise) 140 bpm    Heart Rate (Exit) 92 bpm    Rating of Perceived Exertion (Exercise) 13    Symptoms none    Duration Continue with 30 min of aerobic exercise without signs/symptoms of physical distress.    Intensity THRR unchanged      Progression   Progression Continue to progress workloads to maintain intensity without signs/symptoms of physical distress.    Average METs 5.73      Resistance Training   Training Prescription Yes    Weight 3 lb    Reps 10-15      Interval Training   Interval Training No      Treadmill   MPH 3    Grade 5    Minutes 15    METs 5.36      Rower   Level 10    Watts 57    Minutes 15    METs 6.37      Home Exercise Plan   Plans to continue exercise at [employer] (comment)    Frequency Add 2 additional days to program exercise sessions.    Initial Home Exercises Provided 12/06/23      Oxygen   Maintain Oxygen Saturation 88% or higher          Nutrition:  Target Goals: Understanding of nutrition guidelines, daily intake of sodium 1500mg , cholesterol 200mg , calories 30% from fat and 7% or less from saturated fats, daily to have 5 or more servings of fruits and vegetables.  Education: Nutrition 1 -Group instruction provided by verbal, written material, interactive activities, discussions, models, and posters to present general guidelines for heart  healthy nutrition including macronutrients, label reading, and promoting whole foods over processed counterparts. Education serves as pensions consultant of discussion of heart healthy eating for all. Written material provided at class time.    Education: Nutrition 2 -Group instruction provided by verbal, written material, interactive activities, discussions, models, and posters to present general guidelines for heart healthy nutrition including sodium, cholesterol, and saturated fat. Providing guidance of habit forming to improve blood pressure, cholesterol, and body weight. Written material provided at class time.     Biometrics:   Post Biometrics - 02/02/24 1736        Post  Biometrics   Height 5' (1.524 m)    Weight 144 lb 6.4 oz (65.5 kg)    Waist Circumference 33.5 inches    Hip Circumference 39 inches    Waist to  Hip Ratio 0.86 %    BMI (Calculated) 28.2    Single Leg Stand 26.47 seconds          Nutrition Therapy Plan and Nutrition Goals:  Nutrition Therapy & Goals - 12/13/23 1724       Nutrition Therapy   RD appointment deferred Yes          Nutrition Assessments:  MEDIFICTS Score Key: >=70 Need to make dietary changes  40-70 Heart Healthy Diet <= 40 Therapeutic Level Cholesterol Diet   Picture Your Plate Scores: <59 Unhealthy dietary pattern with much room for improvement. 41-50 Dietary pattern unlikely to meet recommendations for good health and room for improvement. 51-60 More healthful dietary pattern, with some room for improvement.  >60 Healthy dietary pattern, although there may be some specific behaviors that could be improved.    Nutrition Goals Re-Evaluation:  Nutrition Goals Re-Evaluation     Row Name 12/13/23 1724 01/06/24 1731 02/14/24 1750 03/08/24 1730       Goals   Comment Continues to defer RD apt. Patient deferred RD appointment. Patient deferred RD appointment. Patient deferred RD appointment.       Nutrition Goals Discharge (Final  Nutrition Goals Re-Evaluation):  Nutrition Goals Re-Evaluation - 03/08/24 1730       Goals   Comment Patient deferred RD appointment.          Psychosocial: Target Goals: Acknowledge presence or absence of significant depression and/or stress, maximize coping skills, provide positive support system. Participant is able to verbalize types and ability to use techniques and skills needed for reducing stress and depression.   Education: Stress, Anxiety, and Depression - Group verbal and visual presentation to define topics covered.  Reviews how body is impacted by stress, anxiety, and depression.  Also discusses healthy ways to reduce stress and to treat/manage anxiety and depression. Written material provided at class time.   Education: Sleep Hygiene -Provides group verbal and written instruction about how sleep can affect your health.  Define sleep hygiene, discuss sleep cycles and impact of sleep habits. Review good sleep hygiene tips.   Initial Review & Psychosocial Screening:   Quality of Life Scores:   Scores of 19 and below usually indicate a poorer quality of life in these areas.  A difference of  2-3 points is a clinically meaningful difference.  A difference of 2-3 points in the total score of the Quality of Life Index has been associated with significant improvement in overall quality of life, self-image, physical symptoms, and general health in studies assessing change in quality of life.  PHQ-9: Review Flowsheet       11/02/2023  Depression screen PHQ 2/9  Decreased Interest 0  Down, Depressed, Hopeless 0  PHQ - 2 Score 0  Altered sleeping 0  Tired, decreased energy 1  Change in appetite 0  Feeling bad or failure about yourself  0  Trouble concentrating 0  Moving slowly or fidgety/restless 0  Suicidal thoughts 0  PHQ-9 Score 1   Difficult doing work/chores Not difficult at all    Details       Data saved with a previous flowsheet row definition         Interpretation of Total Score  Total Score Depression Severity:  1-4 = Minimal depression, 5-9 = Mild depression, 10-14 = Moderate depression, 15-19 = Moderately severe depression, 20-27 = Severe depression   Psychosocial Evaluation and Intervention:   Psychosocial Re-Evaluation:  Psychosocial Re-Evaluation     Row Name 12/13/23 [phone]  01/06/24 1727 02/14/24 1751 03/08/24 1728       Psychosocial Re-Evaluation   Current issues with History of Depression;Current Depression Current Psychotropic Meds;Current Depression;History of Depression History of Depression;Current Psychotropic Meds;Current Depression Current Stress Concerns    Comments Angel Douglas states that her sleep, stress, and mental health have been steady with no major changes. She states that she feels like she has the resources and support system to deal with these things in her life. She was advised that if she needed any further resources that staff could provide her with information. Angel Douglas states that her depression is mostly there and now is a hard time with whats going on in the world. She does well with exercise and it helps her mood. She went to [place] to her family the last couple. Patient reports no issues with their current mental states, sleep, stress, depression or anxiety. Will follow up with patient in a few weeks for any changes. Angel Douglas states she has alot of stress with work. She wants to finish the program and start exercising on her own. 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 She has three more sessions and is ready to graduate [facility].    Expected Outcomes Short: continue to attend cardiac rehab for mental health benefits of exercise. Long: maintain good mental health routine. Short: Continue to exercise regularly to support mental health and notify staff of any changes. Long: maintain mental health and well being through teaching of rehab or prescribed medications independently. Short: Continue  to exercise regularly to support mental health and notify staff of any changes. Long: maintain mental health and well being through teaching of rehab or prescribed medications independently. --    Interventions Encouraged to attend Cardiac Rehabilitation for the exercise Encouraged to attend Cardiac Rehabilitation for the exercise Encouraged to attend Cardiac Rehabilitation for the exercise Encouraged to attend Cardiac Rehabilitation for the exercise    Continue Psychosocial Services  Follow up required by staff Follow up required by staff No Follow up required No Follow up required       Psychosocial Discharge (Final Psychosocial Re-Evaluation):  Psychosocial Re-Evaluation - 03/08/24 1728       Psychosocial Re-Evaluation   Current issues with Current Stress Concerns    Comments Angel Douglas states she has alot of stress with work. She wants to finish the program and start exercising on her own. 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 She has three more sessions and is ready to graduate [facility].    Interventions Encouraged to attend Cardiac Rehabilitation for the exercise    Continue Psychosocial Services  No Follow up required          Vocational Rehabilitation: Provide vocational rehab assistance to qualifying candidates.   Vocational Rehab Evaluation & Intervention:   Education: Education Goals: Education classes will be provided on a variety of topics geared toward better understanding of heart health and risk factor modification. Participant will state understanding/return demonstration of topics presented as noted by education test scores.  Learning Barriers/Preferences:   General Cardiac Education Topics:  AED/CPR: - Group verbal and written instruction with the use of models to demonstrate the basic use of the AED with the basic ABC's of resuscitation.   Test and Procedures: - Group verbal and visual presentation and models provide information  about basic cardiac anatomy and function. Reviews the testing methods done to diagnose heart disease and the outcomes of the test results.  Describes the treatment choices: Medical Management, Angioplasty, or Coronary Bypass Surgery for treating various heart conditions including Myocardial Infarction, Angina, Valve Disease, and Cardiac Arrhythmias. Written material provided at class time.   Medication Safety: - Group verbal and visual instruction to review commonly prescribed medications for heart and lung disease. Reviews the medication, class of the drug, and side effects. Includes the steps to properly store meds and maintain the prescription regimen. Written material provided at class time.   Intimacy: - Group verbal instruction through game format to discuss how heart and lung disease can affect sexual intimacy. Written material provided at class time.   Know Your Numbers and Heart Failure: - Group verbal and visual instruction to discuss disease risk factors for cardiac and pulmonary disease and treatment options.  Reviews associated critical values for Overweight/Obesity, Hypertension, Cholesterol, and Diabetes.  Discusses basics of heart failure: signs/symptoms and treatments.  Introduces Heart Failure Zone chart for action plan for heart failure. Written material provided at class time.   Infection Prevention: - Provides verbal and written material to individual with discussion of infection control including proper hand washing and proper equipment cleaning during exercise session. Flowsheet Row Cardiac Rehab from 11/02/2023 in [facility] Cardiac [facility] Pulmonary Rehab  Date 11/02/23  Educator [facility]  Instruction Review Code 1- Verbalizes Understanding    Falls Prevention: - Provides verbal and written material to individual with discussion of falls prevention and safety. Flowsheet Row Cardiac Rehab from 11/02/2023 in [facility] Cardiac and Pulmonary Rehab  Date 11/02/23  Educator [facility]  Instruction  Review Code 1- Verbalizes Understanding    Other: -Provides group and verbal instruction on various topics (see comments)   Knowledge Questionnaire Score:   Core Components/Risk Factors/Patient Goals at Admission:   Education:Diabetes - Individual verbal and written instruction to review signs/symptoms of diabetes, desired ranges of glucose level fasting, after meals and with exercise. Acknowledge that pre and post exercise glucose checks will be done for 3 sessions at entry of program.   Core Components/Risk Factors/Patient Goals Review:   Goals and Risk Factor Review     Row Name 12/13/23 1725 01/06/24 1732 02/14/24 1753 03/08/24 1731       Core Components/Risk Factors/Patient Goals Review   Personal Goals Review Weight Management/Obesity;Hypertension Other;Weight Management/Obesity Other Weight Management/Obesity    Review Angel Douglas states that she has not been checking BP at home because her blood pressure cuff had dead batteries. She states that she has recently ordered batteries and plans to start checking BP at home once she gets them. Angel Douglas states that she has gained some weight from when she was sick but feels that a lot of that is muscle as she states she is feeling stronger and feels like she has more musculare strength. More recently her weight has been steady, which she is happy with. Angel Douglas was out for two weeks on vacation and came back without weight gain. Improving Strength and stamina is one of the most important things to her. Informed her that she is doing well and increasing her loads on all her machines. Angel Douglas would like to come off her Plavix  if she can. She informed staff that she has a follow up in December. Informed her to ask her doctor if she could come off or reduce the dose if she is able to. Angel Douglas is graduating the program in a few sessions. She has improved her walk test and plans to exercise once discharged. She still wants to lose weight and reach her weight goal. Her  blood pressure has been good and has had no cardiac events while in [facility].    Expected Outcomes Short: get BP cuff working at home and start checking BP at home. Long: control cardiac risk factors. Short: improve strength and stamina. Long: continue to workout independently post [facility]. Short: speak with Dr about Plavix . Long: take medications as prescribed. Short: lose a few pounds and graduate. Long: Maintain exercise Post [facility]       Core Components/Risk Factors/Patient Goals at Discharge (Final Review):   Goals and Risk Factor Review - 03/08/24 1731       Core Components/Risk Factors/Patient Goals Review   Personal Goals Review Weight Management/Obesity    Review Angel Douglas is graduating the program in a few sessions. She has improved her walk test and plans to exercise once discharged. She still wants to lose weight and reach her weight goal. Her blood pressure has been good and has had no cardiac events while in [facility].    Expected Outcomes Short: lose a few pounds and graduate. Long: Maintain exercise Post [facility]          ITP Comments:  ITP Comments     Row Name 11/24/23 0742 12/22/23 0801 01/19/24 1000 02/16/24 0822 03/15/24 0934   ITP Comments 30 Day review completed. Medical Director ITP review done, changes made as directed, and signed approval by Medical Director. New to program. 30 Day review completed. Medical Director ITP review done; changes made as directed and signed approval by Medical Director. 30 Day review completed. Medical Director ITP review done; changes made as directed and signed approval by Medical Director. 30 Day review completed. Medical Director ITP review done; changes made as directed and signed approval by Medical Director. 30 Day review completed. Medical Director ITP review done; changes made as directed and signed approval by Medical Director.    Row Name 04/12/24 [phone] 05/10/24 0748         ITP Comments 30 Day review completed.  Medical Director ITP review done; changes made as directed and signed approval by Medical Director. Patient has not been to rehab since 03/09/2024. Patient discharged.         Comments: discharge ITP    [1]  Current Outpatient Medications:    acetaminophen  (TYLENOL ) 325 MG tablet, Take 2 tablets (650 mg total) by mouth every 6 (six) hours as needed for mild pain (or Fever >/= 101)., Disp: 20 tablet, Rfl: 0   ALLEGRA-D ALLERGY & CONGESTION 180-240 MG 24 hr tablet, Take 1 tablet by mouth daily., Disp: , Rfl:    aspirin  EC 81 MG tablet, Take 1 tablet (81 mg total) by mouth daily. Swallow whole., Disp: 30 tablet, Rfl: 11   buPROPion (WELLBUTRIN XL) 300 MG 24 hr tablet, Take 300 mg by mouth daily., Disp: , Rfl:    estradiol (ESTRACE) 0.1 MG/GM vaginal cream, Place vaginally., Disp: , Rfl:    isosorbide  mononitrate (IMDUR ) 30 MG 24 hr tablet, Take 1 tablet (30 mg total) by mouth daily. (Patient not taking: Reported on 10/27/2023), Disp: 30 tablet, Rfl: 11   metoprolol  succinate (TOPROL -XL) 25 MG 24 hr tablet, Take 0.5 tablets (12.5 mg total) by mouth daily. (Patient not taking: Reported on 10/27/2023), Disp: 15 tablet, Rfl: 11   MOUNJARO 15 MG/0.5ML Pen, , Disp: , Rfl:    Multiple Vitamin (MULTI-VITAMINS) TABS, Take by mouth., Disp: , Rfl:    rosuvastatin  (CRESTOR ) 5 MG tablet, Take 1 tablet (5 mg total) by mouth daily., Disp: 30 tablet, Rfl: 11 [2]  Social History Tobacco Use  Smoking Status Former   Types: Cigarettes   Start date: 10/27/1998   Quit date: 10/27/1983   Years since quitting: 40.5  Smokeless Tobacco Never  Tobacco Comments   Quit 25ya, 15 pack year

## 2024-05-10 NOTE — Progress Notes (Signed)
 Discharge Progress Report  Patient Details  Name: Angel Douglas MRN: [id number] Date of Birth: 07/13/1960 Referring Provider:   Flowsheet Row Cardiac Rehab from 11/02/2023 in [facility]  Referring Provider Dr. Cara Lovelace     Number of Visits: 35/36  Reason for Discharge:  Patient reached a stable level of exercise. Patient independent in their exercise. Patient has met program and personal goals.  Smoking History:  Tobacco Use History[1]  Diagnosis:  NSTEMI (non-ST elevated myocardial infarction) (HCC)  ADL [facility]:   Initial Exercise Prescription:   Discharge Exercise Prescription (Final Exercise Prescription Changes):  Exercise Prescription Changes - 03/21/24 1400       Response to Exercise   Blood Pressure (Admit) 118/70    Blood Pressure (Exit) 104/72    Heart Rate (Admit) 73 bpm    Heart Rate (Exercise) 140 bpm    Heart Rate (Exit) 92 bpm    Rating of Perceived Exertion (Exercise) 13    Symptoms none    Duration Continue with 30 min of aerobic exercise without signs/symptoms of physical distress.    Intensity THRR unchanged      Progression   Progression Continue to progress workloads to maintain intensity without signs/symptoms of physical distress.    Average METs 5.73      Resistance Training   Training Prescription Yes    Weight 3 lb    Reps 10-15      Interval Training   Interval Training No      Treadmill   MPH 3    Grade 5    Minutes 15    METs 5.36      Rower   Level 10    Watts 57    Minutes 15    METs 6.37      Home Exercise Plan   Plans to continue exercise at [employer] (comment)    Frequency Add 2 additional days to program exercise sessions.    Initial Home Exercises Provided 12/06/23      Oxygen   Maintain Oxygen Saturation 88% or higher          Functional Capacity:  6 Minute Walk     Row Name 02/02/24 1734         6 Minute Walk   Phase Discharge     Distance 1605 feet      Distance % Change 11 %     Distance Feet Change 165 ft     Walk Time 6 minutes     # of Rest Breaks 0     MPH 3.04     METS 3.8     RPE 10     Perceived Dyspnea  0     VO2 Peak 13.32     Symptoms No     Resting HR 74 bpm     Resting BP 128/82     Max Ex. HR 103 bpm     Max Ex. BP 142/80     2 Minute Post BP 124/80        Psychological, QOL, Others - Outcomes: PHQ 2/9:    11/02/2023    3:14 PM  Depression screen PHQ 2/9  Decreased Interest 0  Down, Depressed, Hopeless 0  PHQ - 2 Score 0  Altered sleeping 0  Tired, decreased energy 1  Change in appetite 0  Feeling bad or failure about yourself  0  Trouble concentrating 0  Moving slowly or fidgety/restless 0  Suicidal thoughts 0  PHQ-9  Score 1   Difficult doing work/chores Not difficult at all     Data saved with a previous flowsheet row definition    Quality of Life:   Personal Goals: Goals established at orientation with interventions provided to work toward goal.    Personal Goals Discharge:  Goals and Risk Factor Review     Row Name 12/13/23 1725 01/06/24 1732 02/14/24 1753 03/08/24 1731       Core Components/Risk Factors/Patient Goals Review   Personal Goals Review Weight Management/Obesity;Hypertension Other;Weight Management/Obesity Other Weight Management/Obesity    Review Angel Douglas states that she has not been checking BP at home because her blood pressure cuff had dead batteries. She states that she has recently ordered batteries and plans to start checking BP at home once she gets them. Angel Douglas states that she has gained some weight from when she was sick but feels that a lot of that is muscle as she states she is feeling stronger and feels like she has more musculare strength. More recently her weight has been steady, which she is happy with. Angel Douglas was out for two weeks on vacation and came back without weight gain. Improving Strength and stamina is one of the most important things to her. Informed her that she is  doing well and increasing her loads on all her machines. Angel Douglas would like to come off her Plavix  if she can. She informed staff that she has a follow up in December. Informed her to ask her doctor if she could come off or reduce the dose if she is able to. Angel Douglas is graduating the program in a few sessions. She has improved her walk test and plans to exercise once discharged. She still wants to lose weight and reach her weight goal. Her blood pressure has been good and has had no cardiac events while in [facility].    Expected Outcomes Short: get BP cuff working at home and start checking BP at home. Long: control cardiac risk factors. Short: improve strength and stamina. Long: continue to workout independently post [facility]. Short: speak with Dr about Plavix . Long: take medications as prescribed. Short: lose a few pounds and graduate. Long: Maintain exercise Post [facility]       Exercise Goals and Review:   Exercise Goals Re-Evaluation:  Exercise Goals Re-Evaluation     Row Name 11/19/23 1029 12/01/23 1557 12/06/23 1736 12/13/23 1608 12/29/23 1029     Exercise Goal Re-Evaluation   Exercise Goals Review Increase Physical Activity;Increase Strength and Stamina;Understanding of Exercise Prescription Increase Physical Activity;Increase Strength and Stamina;Understanding of Exercise Prescription Increase Physical Activity;Increase Strength and Stamina;Able to understand and use rate of perceived exertion (RPE) scale;Able to understand and use Dyspnea scale;Knowledge and understanding of Target Heart Rate Range (THRR);Able to check pulse independently;Understanding of Exercise Prescription Increase Physical Activity;Increase Strength and Stamina;Understanding of Exercise Prescription Increase Physical Activity;Increase Strength and Stamina;Understanding of Exercise Prescription   Comments Angel Douglas is off to a great start in rehab. She was able to attend her first 3 sessions during this weeks review period.  During these sessions she was able to increase from 2.7 to 3. on the treadmill, and increase to level 5 on the XR. We will continue to monitor her progress in the program. Angel Douglas continues to do well in rehab. She was recently able to increase her treadmill incline from 5% to 8%, while maintaining a speed of . She was also able to increase from level 5 to level 10 on the XR. We will continue to  monitor her progress in the program. Reviewed home exercise with pt today.  Pt plans to go to her community gym for exercise.  Reviewed THR, pulse, RPE, sign and symptoms, pulse oximetery and when to call 911 or MD.  Also discussed weather considerations and indoor options.  Pt voiced understanding. Angel Douglas continues to do well in rehab. She recently increased her treadmill workload to a speed of 3.4 mph with an incline of 7%. She also improved to level 4 on the T4 nustep. She did decrease from 7 lb to 3 lb handweights for resistance training as well. We will continue to monitor her progress in the program. Angel Douglas continues to make improvements in rehab. She has been able to increase from level 4 to 5 on the T4 nustep, as well as increase from level 2 to 10 on the rower. We will continue to monitor her progress in the program.   Expected Outcomes Short: Continue to follow exercise prescription. Long: Continue exercise to improve strength and stamina. Short: Continue to increase treadmill workload. Long: Continue exercise to improev strength and stamina. Short: add 1-2 days per week of exercise on off days of cardiac rehab. Long: maintain independent exercise routine upon graduation from cardiac rehab. Short: Increase back up to 7 lb hand weights for resistance training. Long: Continue exercise to increase strength and stamina. Short: Continue to increase workload on the rowing machine. Long: Continue exercise to improve strength and stamina.    Row Name 01/11/24 1458 01/25/24 0931 01/31/24 1732 02/02/24 1735 02/09/24 1621      Exercise Goal Re-Evaluation   Exercise Goals Review Increase Physical Activity;Increase Strength and Stamina;Understanding of Exercise Prescription Increase Physical Activity;Increase Strength and Stamina;Understanding of Exercise Prescription Increase Physical Activity;Increase Strength and Stamina;Understanding of Exercise Prescription Increase Physical Activity;Increase Strength and Stamina;Understanding of Exercise Prescription Increase Physical Activity;Increase Strength and Stamina;Understanding of Exercise Prescription   Comments Angel Douglas continues to do well in rehab. She recently increased her speed on the treadmill to 3.3 mph while maintaining an incline of 5%. Her workload on the rower also decreased from level 10 to level 6 since the last review. We will continue to monitor her progress in the program. Angel Douglas continues to do well in rehab. She recently increased her speed on the treadmill back up to to 3.4 mph with an incline of 7%. She also improved her level on the rower back up to level 10. We will continue to monitor her progress in the program. Home exercise follow up done today. Angel Douglas reports that she has added one day a week of exercise at home in addtion to her 3 days a week in cardiac rehab. She is using a engineer, drilling and walking. She reports that she is tolerating her independent exercise well with no conerns to report. Angel Douglas improved on her 6 MWT by 11%! She will graduate from cardiac rehab in the near future and plans to use a community gym to continue with her exercise routine. Angel Douglas continues to do well in rehab and she will graduate soon. She improved by 11% on her post with a total of 1605 feet. She also increased her incline on the treadmill to 7.5% while maintaining a speed of 3.1 mph. She increased her average watts on the rower to 62 watts at level 10. We will continue to monitor her progress in the program until graduation.   Expected Outcomes Short: Increase workload on the  Rower back up to level 10. Long: Continue exercise to improve strength  and stamina. Short: Continue to progressively increase treadmill workload. Long: Continue exercise to improve strength and stamina. Short: continue to consistently exercise one day a week outside of class, and progress to 2. Long: maintain independent exercise routine upon graduation from cardiac rehab. Short: graduate Long: maintain independent exercise routine. Short: Graduate. Long: Continue to exercise independently.    Row Name 02/24/24 1005 03/08/24 1121 03/21/24 1424 04/05/24 1109 04/17/24 0955     Exercise Goal Re-Evaluation   Exercise Goals Review Increase Physical Activity;Increase Strength and Stamina;Understanding of Exercise Prescription Increase Physical Activity;Increase Strength and Stamina;Understanding of Exercise Prescription Increase Physical Activity;Increase Strength and Stamina;Understanding of Exercise Prescription Increase Physical Activity;Increase Strength and Stamina;Understanding of Exercise Prescription --   Comments Angel Douglas continues to do well in rehab and she will graduate soon. She improved by 11% on her post . She continues to walk on the treadmill at a speed of 3 mph with an incline of 6%. We will continue to monitor her progress in the program until graduation. Angel Douglas has not attended rehab since the last review. We will call her to see when she plans to return to the program. We will continue to monitor her progress until she graduates from the program. Angel Douglas has only attended two sessions since the last review. She continues to work at level 10 on the rower, and walk the treadmill at 3 mph with an incline of 5%. We will continue to monitor her progress until she graduates from the program. Angel Douglas has not atttended since 03/09/24. She has been very busy with her job making it difficult to make rehab sessions. We are in communication with her to determine when she can come back. We will continue monitor her  progress when she returns until graduation. Angel Douglas has not atttended since 03/09/24. She is still very busy with her job making it difficult to make rehab sessions. We are still in communication with her to determine when she can come back. We will continue monitor her progress when she returns until graduation.   Expected Outcomes Short: Graduate. Long: Continue to exercise independently. Short: Return to rehab when appropriate. Long: Graduate. Short: Graduate. Long: Continue to exercise independently. Short: Return and graduate. Long: Continue to exercise independently. Short: Return and graduate. Long: Continue to exercise independently.    Row Name 05/03/24 1552             Exercise Goal Re-Evaluation   Exercise Goals Review Increase Physical Activity;Increase Strength and Stamina;Understanding of Exercise Prescription       Comments Angel Douglas has not atttended since 03/09/24. She is still very busy with her job making it difficult to make rehab sessions. We are still in communication with her to determine when she can come back. We will continue monitor her progress when she returns until graduation.       Expected Outcomes Short: Return and graduate. Long: Continue to exercise independently.          Nutrition & Weight - Outcomes:   Post Biometrics - 02/02/24 1736        Post  Biometrics   Height 5' (1.524 m)    Weight 144 lb 6.4 oz (65.5 kg)    Waist Circumference 33.5 inches    Hip Circumference 39 inches    Waist to Hip Ratio 0.86 %    BMI (Calculated) 28.2    Single Leg Stand 26.47 seconds          Nutrition:  Nutrition Therapy & Goals - 12/13/23 1724  Nutrition Therapy   RD appointment deferred Yes          Nutrition Discharge:   Education Questionnaire Score:   Goals reviewed with patient; copy given to patient.    [1]  Social History Tobacco Use  Smoking Status Former   Types: Cigarettes   Start date: 10/27/1998   Quit date: 10/27/1983   Years since  quitting: 40.5  Smokeless Tobacco Never  Tobacco Comments   Quit 25ya, 15 pack year

## 2024-05-25 ENCOUNTER — Encounter: Payer: Self-pay | Admitting: Emergency Medicine

## 2024-05-25 NOTE — Progress Notes (Signed)
 Entered in error
